# Patient Record
Sex: Female | Born: 1990 | Race: Black or African American | Hispanic: No | Marital: Single | Smoking: Former smoker
Health system: Southern US, Community
[De-identification: ages and names within clinical notes are randomized; demographics above are authoritative.]

## PROBLEM LIST (undated history)

## (undated) DIAGNOSIS — N83209 Unspecified ovarian cyst, unspecified side: Secondary | ICD-10-CM

## (undated) HISTORY — PX: ABDOMINAL HYSTERECTOMY: SHX81

## (undated) HISTORY — PX: UTERINE FIBROID SURGERY: SHX826

## (undated) HISTORY — PX: CARPAL TUNNEL RELEASE: SHX101

## (undated) HISTORY — PX: DILATION AND CURETTAGE OF UTERUS: SHX78

## (undated) HISTORY — PX: BREAST SURGERY: SHX581

---
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
## 2008-01-13 ENCOUNTER — Emergency Department (HOSPITAL_BASED_OUTPATIENT_CLINIC_OR_DEPARTMENT_OTHER): Admission: EM | Admit: 2008-01-13 | Discharge: 2008-01-13 | Payer: Self-pay | Admitting: Emergency Medicine

## 2008-01-18 ENCOUNTER — Emergency Department (HOSPITAL_BASED_OUTPATIENT_CLINIC_OR_DEPARTMENT_OTHER): Admission: EM | Admit: 2008-01-18 | Discharge: 2008-01-18 | Payer: Self-pay | Admitting: Emergency Medicine

## 2008-09-30 ENCOUNTER — Emergency Department (HOSPITAL_BASED_OUTPATIENT_CLINIC_OR_DEPARTMENT_OTHER): Admission: EM | Admit: 2008-09-30 | Discharge: 2008-09-30 | Payer: Self-pay | Admitting: Emergency Medicine

## 2008-10-02 ENCOUNTER — Emergency Department (HOSPITAL_BASED_OUTPATIENT_CLINIC_OR_DEPARTMENT_OTHER): Admission: EM | Admit: 2008-10-02 | Discharge: 2008-10-02 | Payer: Self-pay | Admitting: Emergency Medicine

## 2008-10-19 ENCOUNTER — Emergency Department (HOSPITAL_BASED_OUTPATIENT_CLINIC_OR_DEPARTMENT_OTHER): Admission: EM | Admit: 2008-10-19 | Discharge: 2008-10-19 | Payer: Self-pay | Admitting: Emergency Medicine

## 2009-08-19 ENCOUNTER — Emergency Department (HOSPITAL_BASED_OUTPATIENT_CLINIC_OR_DEPARTMENT_OTHER): Admission: EM | Admit: 2009-08-19 | Discharge: 2009-08-19 | Payer: Self-pay | Admitting: Emergency Medicine

## 2009-10-25 ENCOUNTER — Emergency Department (HOSPITAL_BASED_OUTPATIENT_CLINIC_OR_DEPARTMENT_OTHER): Admission: EM | Admit: 2009-10-25 | Discharge: 2009-10-25 | Payer: Self-pay | Admitting: Emergency Medicine

## 2009-11-10 ENCOUNTER — Emergency Department (HOSPITAL_BASED_OUTPATIENT_CLINIC_OR_DEPARTMENT_OTHER): Admission: EM | Admit: 2009-11-10 | Discharge: 2009-11-10 | Payer: Self-pay | Admitting: Emergency Medicine

## 2009-12-24 ENCOUNTER — Emergency Department (HOSPITAL_BASED_OUTPATIENT_CLINIC_OR_DEPARTMENT_OTHER): Admission: EM | Admit: 2009-12-24 | Discharge: 2009-12-24 | Payer: Self-pay | Admitting: Emergency Medicine

## 2010-03-02 ENCOUNTER — Emergency Department (HOSPITAL_BASED_OUTPATIENT_CLINIC_OR_DEPARTMENT_OTHER)
Admission: EM | Admit: 2010-03-02 | Discharge: 2010-03-02 | Payer: Self-pay | Source: Home / Self Care | Admitting: Emergency Medicine

## 2010-06-08 LAB — URINALYSIS, ROUTINE W REFLEX MICROSCOPIC
Glucose, UA: NEGATIVE mg/dL
Hgb urine dipstick: NEGATIVE
Ketones, ur: NEGATIVE mg/dL
Protein, ur: NEGATIVE mg/dL
Specific Gravity, Urine: 1.027 (ref 1.005–1.030)
Urobilinogen, UA: 1 mg/dL (ref 0.0–1.0)

## 2010-06-08 LAB — URINE MICROSCOPIC-ADD ON

## 2010-06-08 LAB — WET PREP, GENITAL: Trich, Wet Prep: NONE SEEN

## 2010-06-10 LAB — PREGNANCY, URINE: Preg Test, Ur: POSITIVE

## 2010-06-11 LAB — PREGNANCY, URINE: Preg Test, Ur: NEGATIVE

## 2010-06-11 LAB — URINALYSIS, ROUTINE W REFLEX MICROSCOPIC
Bilirubin Urine: NEGATIVE
Hgb urine dipstick: NEGATIVE
Urobilinogen, UA: 0.2 mg/dL (ref 0.0–1.0)
pH: 6 (ref 5.0–8.0)

## 2010-06-12 LAB — HCG, QUANTITATIVE, PREGNANCY: hCG, Beta Chain, Quant, S: 129 m[IU]/mL — ABNORMAL HIGH (ref <5)

## 2010-06-14 LAB — URINALYSIS, ROUTINE W REFLEX MICROSCOPIC
Protein, ur: NEGATIVE mg/dL
Specific Gravity, Urine: 1.031 — ABNORMAL HIGH (ref 1.005–1.030)
Urobilinogen, UA: 1 mg/dL (ref 0.0–1.0)
pH: 6 (ref 5.0–8.0)

## 2010-06-14 LAB — PREGNANCY, URINE: Preg Test, Ur: NEGATIVE

## 2010-06-14 LAB — URINE MICROSCOPIC-ADD ON

## 2010-06-14 LAB — WET PREP, GENITAL
Trich, Wet Prep: NONE SEEN
Yeast Wet Prep HPF POC: NONE SEEN

## 2010-07-20 ENCOUNTER — Emergency Department (HOSPITAL_BASED_OUTPATIENT_CLINIC_OR_DEPARTMENT_OTHER)
Admission: EM | Admit: 2010-07-20 | Discharge: 2010-07-20 | Disposition: A | Discharge status: Home / Self Care | Payer: Medicaid Other | Attending: Emergency Medicine | Admitting: Emergency Medicine

## 2010-07-20 DIAGNOSIS — L723 Sebaceous cyst: Secondary | ICD-10-CM | POA: Insufficient documentation

## 2010-07-20 DIAGNOSIS — R220 Localized swelling, mass and lump, head: Secondary | ICD-10-CM | POA: Insufficient documentation

## 2010-10-11 ENCOUNTER — Encounter: Payer: Self-pay | Admitting: *Deleted

## 2010-10-11 ENCOUNTER — Emergency Department (HOSPITAL_BASED_OUTPATIENT_CLINIC_OR_DEPARTMENT_OTHER)
Admission: EM | Admit: 2010-10-11 | Discharge: 2010-10-11 | Disposition: A | Discharge status: Home / Self Care | Payer: Medicaid Other | Attending: Emergency Medicine | Admitting: Emergency Medicine

## 2010-10-11 DIAGNOSIS — H5710 Ocular pain, unspecified eye: Secondary | ICD-10-CM | POA: Insufficient documentation

## 2010-10-11 DIAGNOSIS — H00019 Hordeolum externum unspecified eye, unspecified eyelid: Secondary | ICD-10-CM | POA: Insufficient documentation

## 2010-10-11 MED ORDER — TOBRAMYCIN 0.3 % OP SOLN: 1.0000 [drp] | OPHTHALMIC | Status: AC

## 2010-10-11 NOTE — ED Provider Notes (Signed)
History     Chief Complaint  Patient presents with  . Eye Pain   HPI Comments: Pt reports she awoke with swelling to her left eye yesterday morning and noticed a knot.  Pt complains of swelling and sorness  Patient is a 20 y.o. female presenting with eye pain. The history is provided by the patient.  Eye Pain This is a new problem. The current episode started yesterday. Pertinent negatives include no visual change. The symptoms are aggravated by nothing. She has tried nothing for the symptoms.    History reviewed. No pertinent past medical history.  History reviewed. No pertinent past surgical history.  History reviewed. No pertinent family history.  History  Substance Use Topics  . Smoking status: Never Smoker   . Smokeless tobacco: Not on file  . Alcohol Use: No    OB History    Grav Para Term Preterm Abortions TAB SAB Ect Mult Living                  Review of Systems  Eyes: Positive for pain.    Physical Exam  BP 123/73  Pulse 72  Temp(Src) 98.9 F (37.2 C) (Oral)  Resp 16  Ht 5\' 5"  (1.651 m)  Wt 150 lb (68.04 kg)  BMI 24.96 kg/m2  SpO2 100%  Physical Exam  Constitutional: She appears well-developed and well-nourished.  HENT:  Head: Normocephalic.  Eyes: Left eye exhibits discharge.  Neck: Normal range of motion. Neck supple.  Cardiovascular: Normal rate.   Pulmonary/Chest: Effort normal.  Skin: Skin is warm and dry.  Psychiatric: She has a normal mood and affect.    ED Course  Procedures  MDM  Pt counseled on need to follow up with opthamology in 1 week if area persist.      Langston Masker, [place] 10/11/10 1232

## 2010-10-11 NOTE — ED Notes (Signed)
Pt amb to room 8 with quick steady gait in nad.  Pt reports sudden onset of left eye pain and drainage yesterday.

## 2010-10-25 NOTE — ED Provider Notes (Signed)
History/physical exam/procedure(s) were performed by non-physician practitioner and as supervising physician I was immediately available for consultation/collaboration. I have reviewed all notes and am in agreement with care and plan.   Hilario Quarry, MD 10/25/10 1452

## 2010-12-28 LAB — COMPREHENSIVE METABOLIC PANEL
ALT: 18
CO2: 27
Calcium: 9.3
Creatinine, Ser: 0.7
GFR calc Af Amer: >60
GFR calc non Af Amer: >60
Glucose, Bld: 95
Sodium: 141
Total Protein: 8.4 — ABNORMAL HIGH

## 2010-12-28 LAB — BASIC METABOLIC PANEL
BUN: 9
CO2: 26
Chloride: 103
Creatinine, Ser: 0.9
GFR calc Af Amer: >60
Potassium: 3.7

## 2010-12-28 LAB — URINALYSIS, ROUTINE W REFLEX MICROSCOPIC
Bilirubin Urine: NEGATIVE
Bilirubin Urine: NEGATIVE
Glucose, UA: NEGATIVE
Glucose, UA: NEGATIVE
Hgb urine dipstick: NEGATIVE
Ketones, ur: NEGATIVE
Ketones, ur: NEGATIVE
Leukocytes, UA: NEGATIVE
Leukocytes, UA: NEGATIVE
Nitrite: NEGATIVE
Nitrite: NEGATIVE
Protein, ur: NEGATIVE
Protein, ur: NEGATIVE
Specific Gravity, Urine: 1.016
Specific Gravity, Urine: 1.02
Urobilinogen, UA: 2 — ABNORMAL HIGH
pH: 7
pH: 7

## 2010-12-28 LAB — CBC
MCV: 76.4 — ABNORMAL LOW
RBC: 4.65
RDW: 13.3

## 2010-12-28 LAB — DIFFERENTIAL
Basophils Relative: 0
Eosinophils Relative: 2
Lymphocytes Relative: 19
Lymphs Abs: 1.3
Neutro Abs: 4.8

## 2010-12-28 LAB — WET PREP, GENITAL
Clue Cells Wet Prep HPF POC: NONE SEEN
Yeast Wet Prep HPF POC: NONE SEEN

## 2010-12-28 LAB — URINE MICROSCOPIC-ADD ON

## 2011-06-02 ENCOUNTER — Emergency Department (HOSPITAL_BASED_OUTPATIENT_CLINIC_OR_DEPARTMENT_OTHER)
Admission: EM | Admit: 2011-06-02 | Discharge: 2011-06-03 | Disposition: A | Discharge status: Home / Self Care | Payer: Medicaid Other | Attending: Emergency Medicine | Admitting: Emergency Medicine

## 2011-06-02 ENCOUNTER — Encounter (HOSPITAL_BASED_OUTPATIENT_CLINIC_OR_DEPARTMENT_OTHER): Payer: Self-pay | Admitting: *Deleted

## 2011-06-02 DIAGNOSIS — J029 Acute pharyngitis, unspecified: Secondary | ICD-10-CM | POA: Insufficient documentation

## 2011-06-02 DIAGNOSIS — Z880 Allergy status to penicillin: Secondary | ICD-10-CM | POA: Insufficient documentation

## 2011-06-02 LAB — RAPID STREP SCREEN (MED CTR MEBANE ONLY): Streptococcus, Group A Screen (Direct): NEGATIVE

## 2011-06-02 NOTE — ED Notes (Signed)
Pt began having cold sx and sore throat over the weekend

## 2011-06-03 MED ORDER — KETOROLAC TROMETHAMINE 60 MG/2ML IM SOLN
60.0000 mg | Freq: Once | INTRAMUSCULAR | Status: AC
  Administered 2011-06-03: 60 mg via INTRAMUSCULAR
  Filled 2011-06-03: qty 2

## 2011-06-03 MED ORDER — OXYCODONE-ACETAMINOPHEN 5-325 MG PO TABS: 2.0000 | ORAL_TABLET | ORAL | Status: AC | PRN

## 2011-06-03 NOTE — ED Provider Notes (Signed)
History     CSN: [id number]  Arrival date & time 06/02/11  2311   First MD Initiated Contact with Patient 06/02/11 2357      Chief Complaint  Patient presents with  . Sore Throat    (Consider location/radiation/quality/duration/timing/severity/associated sxs/prior treatment) Patient is a 21 y.o. female presenting with pharyngitis. The history is provided by the patient.  Sore Throat Pertinent negatives include no chest pain, no headaches and no shortness of breath.   the patient is a 21 year old, female, with no past medical history.  He does not smoke cigarettes, who presents to emergency department complaining of a nonproductive cough, and sore throat for several days.  She has not had a fever, nausea, or vomiting.  Nothing else hurts besides her throat.  She is not taking any medications.  She is allergic to penicillin.  History reviewed. No pertinent past medical history.  History reviewed. No pertinent past surgical history.  History reviewed. No pertinent family history.  History  Substance Use Topics  . Smoking status: Never Smoker   . Smokeless tobacco: Not on file  . Alcohol Use: No    OB History    Grav Para Term Preterm Abortions TAB SAB Ect Mult Living                  Review of Systems  Constitutional: Negative for fever and chills.  HENT: Positive for sore throat. Negative for congestion and voice change.   Respiratory: Positive for cough. Negative for shortness of breath.   Cardiovascular: Negative for chest pain.  Gastrointestinal: Negative for nausea and vomiting.  Skin: Negative for rash.  Neurological: Negative for headaches.  All other systems reviewed and are negative.    Allergies  Penicillins  Home Medications   Current Outpatient Rx  Name Route Sig Dispense Refill  . GUAIFENESIN 100 MG/5ML PO LIQD Oral Take 200 mg by mouth 3 (three) times daily as needed. For congestion      BP 136/86  Pulse 75  Temp(Src) 97.5 F (36.4 C) (Oral)   Resp 18  Ht 5\' 6"  (1.676 m)  Wt 170 lb (77.111 kg)  BMI 27.44 kg/m2  SpO2 100%  LMP 05/05/2011  Physical Exam  Vitals reviewed. Constitutional: She is oriented to person, place, and time. She appears well-developed and well-nourished.  HENT:  Head: Normocephalic and atraumatic.  Mouth/Throat: Oropharynx is clear and moist. No oropharyngeal exudate.  Eyes: Conjunctivae are normal. Pupils are equal, round, and reactive to light.  Neck: Normal range of motion. Neck supple.  Pulmonary/Chest: Effort normal.  Abdominal: She exhibits no distension.  Musculoskeletal: Normal range of motion.  Lymphadenopathy:    She has no cervical adenopathy.  Neurological: She is alert and oriented to person, place, and time.  Skin: Skin is warm and dry.  Psychiatric: She has a normal mood and affect.    ED Course  Procedures (including critical care time) 21 year old, female, with no past medical history presents emergency department with a nonproductive cough, and sore throat for several days.  Her physical examination is normal.  Strep test is negative.  We'll treat her for a viral pharyngitis   Labs Reviewed  RAPID STREP SCREEN   No results found.   No diagnosis found.    MDM  Pharyngitis No exudate and negative strep test.  Her sore throat is associated with a nonproductive cough.  This is consistent with a viral infection.  No antibiotics are indicated.  She has no signs of peritonsillar abscess or  airway compromise        Nicholes Stairs, MD 06/03/11 [phone]

## 2011-08-11 ENCOUNTER — Encounter (HOSPITAL_BASED_OUTPATIENT_CLINIC_OR_DEPARTMENT_OTHER): Payer: Self-pay

## 2011-08-11 ENCOUNTER — Emergency Department (HOSPITAL_BASED_OUTPATIENT_CLINIC_OR_DEPARTMENT_OTHER)
Admission: EM | Admit: 2011-08-11 | Discharge: 2011-08-11 | Disposition: A | Discharge status: Home / Self Care | Payer: Medicaid Other | Attending: Emergency Medicine | Admitting: Emergency Medicine

## 2011-08-11 ENCOUNTER — Emergency Department (HOSPITAL_BASED_OUTPATIENT_CLINIC_OR_DEPARTMENT_OTHER): Payer: Medicaid Other

## 2011-08-11 DIAGNOSIS — N898 Other specified noninflammatory disorders of vagina: Secondary | ICD-10-CM | POA: Insufficient documentation

## 2011-08-11 DIAGNOSIS — R1031 Right lower quadrant pain: Secondary | ICD-10-CM | POA: Insufficient documentation

## 2011-08-11 DIAGNOSIS — R110 Nausea: Secondary | ICD-10-CM | POA: Insufficient documentation

## 2011-08-11 DIAGNOSIS — F17200 Nicotine dependence, unspecified, uncomplicated: Secondary | ICD-10-CM | POA: Insufficient documentation

## 2011-08-11 DIAGNOSIS — R10813 Right lower quadrant abdominal tenderness: Secondary | ICD-10-CM | POA: Insufficient documentation

## 2011-08-11 HISTORY — DX: Unspecified ovarian cyst, unspecified side: N83.209

## 2011-08-11 LAB — CBC
HCT: 36.6 % (ref 36.0–46.0)
Hemoglobin: 12.2 g/dL (ref 12.0–15.0)
MCH: 25.6 pg — ABNORMAL LOW (ref 26.0–34.0)
MCHC: 33.3 g/dL (ref 30.0–36.0)
MCV: 76.7 fL — ABNORMAL LOW (ref 78.0–100.0)
RDW: 14.6 % (ref 11.5–15.5)

## 2011-08-11 LAB — DIFFERENTIAL
Basophils Absolute: 0 10*3/uL (ref 0.0–0.1)
Basophils Relative: 0 % (ref 0–1)
Eosinophils Relative: 2 % (ref 0–5)
Monocytes Absolute: 0.6 10*3/uL (ref 0.1–1.0)
Monocytes Relative: 10 % (ref 3–12)
Neutro Abs: 3.3 10*3/uL (ref 1.7–7.7)

## 2011-08-11 LAB — PREGNANCY, URINE: Preg Test, Ur: NEGATIVE

## 2011-08-11 LAB — URINALYSIS, ROUTINE W REFLEX MICROSCOPIC
Nitrite: NEGATIVE
Protein, ur: 30 mg/dL — AB
Specific Gravity, Urine: 1.035 — ABNORMAL HIGH (ref 1.005–1.030)
Urobilinogen, UA: 1 mg/dL (ref 0.0–1.0)

## 2011-08-11 LAB — BASIC METABOLIC PANEL
BUN: 11 mg/dL (ref 6–23)
Calcium: 9.3 mg/dL (ref 8.4–10.5)
Chloride: 105 mEq/L (ref 96–112)
Creatinine, Ser: 0.9 mg/dL (ref 0.50–1.10)
GFR calc Af Amer: >90 mL/min (ref >90)

## 2011-08-11 LAB — URINE MICROSCOPIC-ADD ON

## 2011-08-11 MED ORDER — HYDROCODONE-ACETAMINOPHEN 5-325 MG PO TABS
1.0000 | ORAL_TABLET | Freq: Once | ORAL | Status: AC
  Administered 2011-08-11: 1 via ORAL
  Filled 2011-08-11: qty 1

## 2011-08-11 MED ORDER — KETOROLAC TROMETHAMINE 60 MG/2ML IM SOLN
60.0000 mg | Freq: Once | INTRAMUSCULAR | Status: AC
  Administered 2011-08-11: 60 mg via INTRAMUSCULAR
  Filled 2011-08-11: qty 2

## 2011-08-11 MED ORDER — HYDROCODONE-ACETAMINOPHEN 5-325 MG PO TABS: 1.0000 | ORAL_TABLET | ORAL | Status: AC | PRN

## 2011-08-11 NOTE — Discharge Instructions (Signed)
Please followup with your primary care physician and her gynecologist if this pain persists.  We have been unable to determine an etiology for your pain but there are no signs of appendicitis or kidney stone on your CAT scan today.  Abdominal Pain Abdominal pain can be caused by many things. Your caregiver decides the seriousness of your pain by an examination and possibly blood tests and X-rays. Many cases can be observed and treated at home. Most abdominal pain is not caused by a disease and will probably improve without treatment. However, in many cases, more time must pass before a clear cause of the pain can be found. Before that point, it may not be known if you need more testing, or if hospitalization or surgery is needed. HOME CARE INSTRUCTIONS   Do not take laxatives unless directed by your caregiver.   Take pain medicine only as directed by your caregiver.   Only take over-the-counter or prescription medicines for pain, discomfort, or fever as directed by your caregiver.   Try a clear liquid diet (broth, tea, or water) for as long as directed by your caregiver. Slowly move to a bland diet as tolerated.  SEEK IMMEDIATE MEDICAL CARE IF:   The pain does not go away.   You have a fever.   You keep throwing up (vomiting).   The pain is felt only in portions of the abdomen. Pain in the right side could possibly be appendicitis. In an adult, pain in the left lower portion of the abdomen could be colitis or diverticulitis.   You pass bloody or black tarry stools.  MAKE SURE YOU:   Understand these instructions.   Will watch your condition.   Will get help right away if you are not doing well or get worse.  Document Released: 12/22/2004 Document Revised: 03/03/2011 Document Reviewed: 10/31/2007 [facility] Patient Information 2012 [place], [place].

## 2011-08-11 NOTE — ED Provider Notes (Addendum)
History     CSN: [id number]  Arrival date & time 08/11/11  2045   First MD Initiated Contact with Patient 08/11/11 2124      Chief Complaint  Patient presents with  . Abdominal Pain    (Consider location/radiation/quality/duration/timing/severity/associated sxs/prior treatment) HPI Comments: Patient presents complaining of right lower quadrant pain for approximately 5 days.  Patient notes it's been constant.  She had some mild nausea yesterday but has had no significant other vomiting or diarrhea symptoms.  Her bowel movements are normal.  No dysuria or gross hematuria.  Patient states her menstrual period was last week but she has noted some spotting this week.  She did see her gynecologist today because she thought she might have another ovarian cyst.  She did have an ultrasound there where her gynecologist noted she had no ovarian cyst and at other testing which demonstrated no signs of infection per her report.  Patient does deny vaginal discharge.  He recommended she seek further evaluation for possible appendicitis.  Patient is a 21 y.o. female presenting with abdominal pain. The history is provided by the patient. No language interpreter was used.  Abdominal Pain The primary symptoms of the illness include abdominal pain, nausea and vaginal bleeding. The primary symptoms of the illness do not include fever, fatigue, shortness of breath, vomiting, diarrhea, hematemesis, hematochezia, dysuria or vaginal discharge. The current episode started more than 2 days ago. The onset of the illness was gradual. The problem has not changed since onset. Symptoms associated with the illness do not include chills or back pain.    Past Medical History  Diagnosis Date  . Ovarian cyst     History reviewed. No pertinent past surgical history.  History reviewed. No pertinent family history.  History  Substance Use Topics  . Smoking status: Current Some Day Smoker  . Smokeless tobacco: Not on file   . Alcohol Use: Yes    OB History    Grav Para Term Preterm Abortions TAB SAB Ect Mult Living                  Review of Systems  Constitutional: Negative.  Negative for fever, chills and fatigue.  HENT: Negative.   Eyes: Negative.  Negative for discharge and redness.  Respiratory: Negative.  Negative for cough and shortness of breath.   Cardiovascular: Negative.  Negative for chest pain.  Gastrointestinal: Positive for nausea and abdominal pain. Negative for vomiting, diarrhea, hematochezia and hematemesis.  Genitourinary: Positive for vaginal bleeding. Negative for dysuria and vaginal discharge.  Musculoskeletal: Negative.  Negative for back pain.  Skin: Negative.  Negative for color change and rash.  Neurological: Negative.  Negative for syncope and headaches.  Hematological: Negative.  Negative for adenopathy.  Psychiatric/Behavioral: Negative.  Negative for confusion.  All other systems reviewed and are negative.    Allergies  Penicillins  Home Medications   Current Outpatient Rx  Name Route Sig Dispense Refill  . GUAIFENESIN 100 MG/5ML PO LIQD Oral Take 200 mg by mouth 3 (three) times daily as needed. For congestion      BP 141/97  Pulse 81  Temp(Src) 98.3 F (36.8 C) (Oral)  Resp 16  Ht 5\' 6"  (1.676 m)  Wt 175 lb (79.379 kg)  BMI 28.25 kg/m2  SpO2 100%  LMP 08/04/2011  Physical Exam  Nursing note and vitals reviewed. Constitutional: She is oriented to person, place, and time. She appears well-developed and well-nourished.  Non-toxic appearance. She does not have a sickly  appearance.  HENT:  Head: Normocephalic and atraumatic.  Eyes: Conjunctivae, EOM and lids are normal. Pupils are equal, round, and reactive to light. No scleral icterus.  Neck: Trachea normal and normal range of motion. Neck supple.  Cardiovascular: Normal rate, regular rhythm and normal heart sounds.   Pulmonary/Chest: Effort normal and breath sounds normal.  Abdominal: Soft. Normal  appearance. There is tenderness. There is no rebound, no guarding and no CVA tenderness.        Mild right lower quadrant tenderness with deep palpation  Musculoskeletal: Normal range of motion.  Neurological: She is alert and oriented to person, place, and time. She has normal strength.  Skin: Skin is warm, dry and intact. No rash noted.  Psychiatric: She has a normal mood and affect. Her behavior is normal. Judgment and thought content normal.    ED Course  Procedures (including critical care time)  Results for orders placed during the hospital encounter of 08/11/11  URINALYSIS, ROUTINE W REFLEX MICROSCOPIC      Component Value Range   Color, Urine YELLOW  YELLOW    APPearance CLOUDY (*) CLEAR    Specific Gravity, Urine 1.035 (*) 1.005 - 1.030    pH 6.5  5.0 - 8.0    Glucose, UA NEGATIVE  NEGATIVE (mg/dL)   Hgb urine dipstick LARGE (*) NEGATIVE    Bilirubin Urine NEGATIVE  NEGATIVE    Ketones, ur NEGATIVE  NEGATIVE (mg/dL)   Protein, ur 30 (*) NEGATIVE (mg/dL)   Urobilinogen, UA 1.0  0.0 - 1.0 (mg/dL)   Nitrite NEGATIVE  NEGATIVE    Leukocytes, UA SMALL (*) NEGATIVE   PREGNANCY, URINE      Component Value Range   Preg Test, Ur NEGATIVE  NEGATIVE   URINE MICROSCOPIC-ADD ON      Component Value Range   Squamous Epithelial / LPF FEW (*) RARE    WBC, UA 3-6  <3 (WBC/hpf)   RBC / HPF 7-10  <3 (RBC/hpf)   Bacteria, UA FEW (*) RARE   CBC      Component Value Range   WBC 5.8  4.0 - 10.5 (K/uL)   RBC 4.77  3.87 - 5.11 (MIL/uL)   Hemoglobin 12.2  12.0 - 15.0 (g/dL)   HCT 21.3  08.6 - 57.8 (%)   MCV 76.7 (*) 78.0 - 100.0 (fL)   MCH 25.6 (*) 26.0 - 34.0 (pg)   MCHC 33.3  30.0 - 36.0 (g/dL)   RDW 46.9  62.9 - 52.8 (%)   Platelets 236  150 - 400 (K/uL)  DIFFERENTIAL      Component Value Range   Neutrophils Relative 57  43 - 77 (%)   Neutro Abs 3.3  1.7 - 7.7 (K/uL)   Lymphocytes Relative 31  12 - 46 (%)   Lymphs Abs 1.8  0.7 - 4.0 (K/uL)   Monocytes Relative 10  3 - 12 (%)    Monocytes Absolute 0.6  0.1 - 1.0 (K/uL)   Eosinophils Relative 2  0 - 5 (%)   Eosinophils Absolute 0.1  0.0 - 0.7 (K/uL)   Basophils Relative 0  0 - 1 (%)   Basophils Absolute 0.0  0.0 - 0.1 (K/uL)  BASIC METABOLIC PANEL      Component Value Range   Sodium 139  135 - 145 (mEq/L)   Potassium 3.9  3.5 - 5.1 (mEq/L)   Chloride 105  96 - 112 (mEq/L)   CO2 27  19 - 32 (mEq/L)   Glucose, Bld  99  70 - 99 (mg/dL)   BUN 11  6 - 23 (mg/dL)   Creatinine, Ser 0.98  0.50 - 1.10 (mg/dL)   Calcium 9.3  8.4 - 11.9 (mg/dL)   GFR calc non Af Amer 89 (*) >90 (mL/min)   GFR calc Af Amer >90  >90 (mL/min)   Ct Abdomen Pelvis Wo Contrast  08/11/2011  *RADIOLOGY REPORT*  Clinical Data: Right lower quadrant pain and hematuria.  CT ABDOMEN AND PELVIS WITHOUT CONTRAST  Technique:  Multidetector CT imaging of the abdomen and pelvis was performed following the standard protocol without intravenous contrast.  Comparison: 01/13/2008  Findings: The lung bases are clear.  The kidneys appear symmetrical.  No renal, ureteral, or bladder stones are demonstrated.  No pyelocaliectasis or ureterectasis.  The bladder is decompressed without evidence of significant wall thickening.  The unenhanced appearance of the liver, spleen, gallbladder, pancreas, adrenal glands, abdominal aorta, and retroperitoneal lymph nodes is unremarkable.  The stomach, small bowel, and colon are decompressed.  No free air or free fluid in the abdomen.  Fat in the umbilicus.  Pelvis:  The appendix is normal.  Uterus and adnexal structures are not enlarged.  The uterus appears retroverted.  No significant pelvic lymphadenopathy.  Normal alignment of the lumbar vertebrae.  IMPRESSION: No renal or ureteral stone or obstruction.  Normal appendix.  Original Report Authenticated By: Marlon Pel, M.D.        MDM  Patient with right lower quadrant abdominal pain for 4-5 days or longer.  Patient does note she's had some vaginal spotting which might  account for the blood in her urine but I also consideration for kidney stone in this patient's differential diagnosis.  Appendicitis is a consideration but seems less likely since the patient is afebrile and has no anorexia, significant nausea vomiting or changes in bowel habits.  She has already been evaluated by her gynecologist and had a pelvic exam and ultrasound that ruled out ovarian cysts, torsion or other STDs.  Therefore I will not complete a pelvic exam here today.  Nat Christen, MD 08/11/11 2136  Patient with normal laboratory studies and a normal CT at this time.  I do not have an acute cause for the patient's symptoms as she has no signs of appendicitis or kidney stone.  She is artery had gynecologic evaluation as noted above.  She has no leukocytosis to suggest occult infection.  She has a relatively benign abdominal exam at this time as well.  I will suggest further followup with her primary care physician and her gynecologist for outpatient workup as needed  Nat Christen, MD 08/11/11 2254

## 2011-08-11 NOTE — ED Notes (Addendum)
abd pain x 1 week-seen by PCP yesterday- GYN today-no dx

## 2011-08-11 NOTE — ED Notes (Signed)
Pt returned from CT °

## 2011-08-11 NOTE — ED Notes (Signed)
Dr Hosmer at bedside. 

## 2013-03-19 ENCOUNTER — Encounter (HOSPITAL_BASED_OUTPATIENT_CLINIC_OR_DEPARTMENT_OTHER): Payer: Self-pay | Admitting: Emergency Medicine

## 2013-03-19 ENCOUNTER — Emergency Department (HOSPITAL_BASED_OUTPATIENT_CLINIC_OR_DEPARTMENT_OTHER)
Admission: EM | Admit: 2013-03-19 | Discharge: 2013-03-19 | Disposition: A | Discharge status: Home / Self Care | Payer: Medicaid Other | Attending: Emergency Medicine | Admitting: Emergency Medicine

## 2013-03-19 DIAGNOSIS — J069 Acute upper respiratory infection, unspecified: Secondary | ICD-10-CM | POA: Insufficient documentation

## 2013-03-19 DIAGNOSIS — Z880 Allergy status to penicillin: Secondary | ICD-10-CM | POA: Insufficient documentation

## 2013-03-19 DIAGNOSIS — Z87891 Personal history of nicotine dependence: Secondary | ICD-10-CM | POA: Insufficient documentation

## 2013-03-19 DIAGNOSIS — Z8742 Personal history of other diseases of the female genital tract: Secondary | ICD-10-CM | POA: Insufficient documentation

## 2013-03-19 MED ORDER — MOMETASONE FUROATE 50 MCG/ACT NA SUSP: 2.0000 | Freq: Every day | NASAL | Status: DC

## 2013-03-19 MED ORDER — IBUPROFEN 800 MG PO TABS: 800.0000 mg | ORAL_TABLET | Freq: Three times a day (TID) | ORAL | Status: DC

## 2013-03-19 MED ORDER — GUAIFENESIN ER 600 MG PO TB12: 600.0000 mg | ORAL_TABLET | Freq: Two times a day (BID) | ORAL | Status: DC

## 2013-03-19 NOTE — ED Notes (Addendum)
Congestion, cough  Sinus drainage since Saturday,,  No relief w otc meds

## 2013-03-19 NOTE — ED Provider Notes (Signed)
CSN: [id number]     Arrival date & time 03/19/13  0204 History   First MD Initiated Contact with Patient 03/19/13 0220     Chief Complaint  Patient presents with  . Cough   (Consider location/radiation/quality/duration/timing/severity/associated sxs/prior Treatment) Patient is a 22 y.o. female presenting with cough. The history is provided by the patient. No language interpreter was used.  Cough Cough characteristics:  Non-productive Severity:  Mild Onset quality:  Gradual Duration:  1 day Timing:  Intermittent Progression:  Unchanged Chronicity:  New Context: upper respiratory infection   Context: not animal exposure   Relieved by:  Nothing Worsened by:  Nothing tried Ineffective treatments:  None tried Associated symptoms: sinus congestion   Associated symptoms: no chest pain, no diaphoresis, no ear pain, no fever and no wheezing   Risk factors: no chemical exposure     Past Medical History  Diagnosis Date  . Ovarian cyst    History reviewed. No pertinent past surgical history. History reviewed. No pertinent family history. History  Substance Use Topics  . Smoking status: Former Games developer  . Smokeless tobacco: Not on file  . Alcohol Use: Yes   OB History   Grav Para Term Preterm Abortions TAB SAB Ect Mult Living                 Review of Systems  Constitutional: Negative for fever and diaphoresis.  HENT: Positive for congestion. Negative for ear pain.   Respiratory: Positive for cough. Negative for wheezing.   Cardiovascular: Negative for chest pain.  All other systems reviewed and are negative.    Allergies  Penicillins  Home Medications   Current Outpatient Rx  Name  Route  Sig  Dispense  Refill  . guaiFENesin (MUCINEX) 600 MG 12 hr tablet   Oral   Take 1 tablet (600 mg total) by mouth 2 (two) times daily.   14 tablet   0   . ibuprofen (ADVIL,MOTRIN) 800 MG tablet   Oral   Take 1 tablet (800 mg total) by mouth 3 (three) times daily.   21 tablet   0   . mometasone (NASONEX) 50 MCG/ACT nasal spray   Nasal   Place 2 sprays into the nose daily.   17 g   0   . NAPROXEN PO   Oral   Take 1 tablet by mouth daily as needed. Patient used this medication for pain.          BP 130/84  Pulse 80  Temp(Src) 98 F (36.7 C) (Oral)  Resp 18  Ht 5\' 6"  (1.676 m)  Wt 170 lb (77.111 kg)  BMI 27.45 kg/m2  SpO2 95%  LMP 02/26/2013 Physical Exam  Constitutional: She is oriented to person, place, and time. She appears well-developed and well-nourished. No distress.  HENT:  Head: Normocephalic and atraumatic.  Mouth/Throat: Oropharynx is clear and moist. No oropharyngeal exudate.  Eyes: Conjunctivae are normal. Pupils are equal, round, and reactive to light.  Neck: Normal range of motion. Neck supple.  Cardiovascular: Normal rate and regular rhythm.   Pulmonary/Chest: Effort normal and breath sounds normal. She has no wheezes. She has no rales.  Abdominal: Soft. Bowel sounds are normal. There is no tenderness. There is no rebound and no guarding.  Musculoskeletal: Normal range of motion.  Neurological: She is alert and oriented to person, place, and time.  Skin: Skin is warm and dry.  Psychiatric: She has a normal mood and affect.    ED Course  Procedures (including  critical care time) Labs Review Labs Reviewed - No data to display Imaging Review No results found.  EKG Interpretation   None       MDM   1. URI (upper respiratory infection)    Will treat for URI    Elcie Pelster K Jenascia Bumpass-Rasch, MD 03/19/13 [phone]

## 2013-03-19 NOTE — ED Notes (Signed)
Cough and congestion since Saturday. No relief from OTC meds.

## 2013-08-23 ENCOUNTER — Emergency Department (HOSPITAL_BASED_OUTPATIENT_CLINIC_OR_DEPARTMENT_OTHER)
Admission: EM | Admit: 2013-08-23 | Discharge: 2013-08-23 | Disposition: A | Discharge status: Home / Self Care | Payer: Medicaid Other | Attending: Emergency Medicine | Admitting: Emergency Medicine

## 2013-08-23 ENCOUNTER — Encounter (HOSPITAL_BASED_OUTPATIENT_CLINIC_OR_DEPARTMENT_OTHER): Payer: Self-pay | Admitting: Emergency Medicine

## 2013-08-23 DIAGNOSIS — R51 Headache: Secondary | ICD-10-CM | POA: Insufficient documentation

## 2013-08-23 DIAGNOSIS — Z8742 Personal history of other diseases of the female genital tract: Secondary | ICD-10-CM | POA: Insufficient documentation

## 2013-08-23 DIAGNOSIS — M542 Cervicalgia: Secondary | ICD-10-CM | POA: Insufficient documentation

## 2013-08-23 DIAGNOSIS — Z791 Long term (current) use of non-steroidal anti-inflammatories (NSAID): Secondary | ICD-10-CM | POA: Insufficient documentation

## 2013-08-23 DIAGNOSIS — R519 Headache, unspecified: Secondary | ICD-10-CM

## 2013-08-23 DIAGNOSIS — Z880 Allergy status to penicillin: Secondary | ICD-10-CM | POA: Insufficient documentation

## 2013-08-23 DIAGNOSIS — Z87891 Personal history of nicotine dependence: Secondary | ICD-10-CM | POA: Insufficient documentation

## 2013-08-23 MED ORDER — CYCLOBENZAPRINE HCL 10 MG PO TABS: 10.0000 mg | ORAL_TABLET | Freq: Two times a day (BID) | ORAL | Status: DC | PRN

## 2013-08-23 MED ORDER — HYDROCODONE-ACETAMINOPHEN 5-325 MG PO TABS
1.0000 | ORAL_TABLET | Freq: Once | ORAL | Status: AC
  Administered 2013-08-23: 1 via ORAL
  Filled 2013-08-23: qty 1

## 2013-08-23 MED ORDER — HYDROCODONE-ACETAMINOPHEN 5-325 MG PO TABS: 1.0000 | ORAL_TABLET | Freq: Four times a day (QID) | ORAL | Status: DC | PRN

## 2013-08-23 MED ORDER — NAPROXEN 500 MG PO TABS: 500.0000 mg | ORAL_TABLET | Freq: Two times a day (BID) | ORAL | Status: DC

## 2013-08-23 NOTE — ED Provider Notes (Signed)
CSN: [id number]     Arrival date & time 08/23/13  2033 History  This chart was scribed for Vanetta MuldersScott Ras Kollman, MD by Dorothey Basemania Sutton, ED Scribe. This patient was seen in room MH09/MH09 and the patient's care was started at 10:26 PM.    Chief Complaint  Patient presents with  . Back Pain   Patient is a 23 y.o. female presenting with back pain. The history is provided by the patient. No language interpreter was used.  Back Pain Pain location: cervical/upper. Pain severity:  Severe Onset quality:  Gradual Timing:  Constant Progression:  Unchanged Chronicity:  New Context: not recent injury   Relieved by:  None tried Ineffective treatments:  None tried Associated symptoms: headaches   Associated symptoms: no abdominal pain, no chest pain, no dysuria, no fever, no numbness and no paresthesias   Headaches:    Severity:  Moderate   Onset quality:  Gradual   Timing:  Constant   Progression:  Unchanged   Chronicity:  New Risk factors: obesity    HPI Comments: Anna Koch is a 23 y.o. female who presents to the Emergency Department complaining of a constant pain, rated 8/10 currently, to the neck and upper back, worse on the left, onset around 16 hours ago upon waking from sleep. She denies any potential injury or trauma to the areas. Patient reports an associated, left-sided headache that she states presented after the neck pain. She reports a history of similar pain due to muscle spasm, but states that the headache is new. She denies fever, chills nausea, vomiting, numbness, paresthesias, visual disturbance, dizziness, cough, rhinorrhea, sore throat, chest pain, shortness of breath, abdominal pain, nausea, vomiting, diarrhea, dysuria, rash, leg swelling, history of bleeding easily. Patient has an allergy to penicillins. Patient has no other pertinent medical history.   Past Medical History  Diagnosis Date  . Ovarian cyst    History reviewed. No pertinent past surgical history. History  reviewed. No pertinent family history. History  Substance Use Topics  . Smoking status: Former Games developermoker  . Smokeless tobacco: Not on file  . Alcohol Use: Yes   OB History   Grav Para Term Preterm Abortions TAB SAB Ect Mult Living                 Review of Systems  Constitutional: Negative for fever and chills.  HENT: Negative for rhinorrhea and sore throat.   Eyes: Negative for visual disturbance.  Respiratory: Negative for cough and shortness of breath.   Cardiovascular: Negative for chest pain.  Gastrointestinal: Negative for nausea, vomiting, abdominal pain and diarrhea.  Genitourinary: Negative for dysuria and hematuria.  Musculoskeletal: Positive for back pain and neck pain.  Skin: Negative for rash.  Neurological: Positive for headaches. Negative for dizziness, numbness and paresthesias.  Hematological: Does not bruise/bleed easily.  Psychiatric/Behavioral: Negative for confusion.      Allergies  Penicillins  Home Medications   Prior to Admission medications   Medication Sig Start Date End Date Taking? Authorizing Provider  ibuprofen (ADVIL,MOTRIN) 800 MG tablet Take 1 tablet (800 mg total) by mouth 3 (three) times daily. 03/19/13  Yes April K Palumbo-Rasch, MD  cyclobenzaprine (FLEXERIL) 10 MG tablet Take 1 tablet (10 mg total) by mouth 2 (two) times daily as needed for muscle spasms. 08/23/13   Vanetta MuldersScott Roselyn Doby, MD  HYDROcodone-acetaminophen (NORCO/VICODIN) 5-325 MG per tablet Take 1-2 tablets by mouth every 6 (six) hours as needed for moderate pain. 08/23/13   Vanetta MuldersScott Shantea Poulton, MD  naproxen (NAPROSYN) 500 MG  tablet Take 1 tablet (500 mg total) by mouth 2 (two) times daily. 08/23/13   Vanetta Mulders, MD   Triage Vitals: BP 135/94  Pulse 73  Temp(Src) 98.7 F (37.1 C) (Oral)  Resp 16  Ht 5\' 6"  (1.676 m)  Wt 175 lb (79.379 kg)  BMI 28.26 kg/m2  SpO2 100%  Physical Exam  Nursing note and vitals reviewed. Constitutional: She is oriented to person, place, and time.  She appears well-developed and well-nourished. No distress.  HENT:  Head: Normocephalic and atraumatic.  Mouth/Throat: Oropharynx is clear and moist.  Eyes: Conjunctivae and EOM are normal.  Neck: Normal range of motion. Neck supple.  Tenderness to left paraspinal muscles. No spasm.   Cardiovascular: Normal rate, regular rhythm and normal heart sounds.   Pulmonary/Chest: Effort normal and breath sounds normal. No respiratory distress.  Abdominal: Soft. Bowel sounds are normal. She exhibits no distension. There is no tenderness.  Musculoskeletal: Normal range of motion. She exhibits no edema.  Neurological: She is alert and oriented to person, place, and time. No cranial nerve deficit. She exhibits normal muscle tone. Coordination normal.  Skin: Skin is warm and dry.  Psychiatric: She has a normal mood and affect. Her behavior is normal.    ED Course  Procedures (including critical care time)  DIAGNOSTIC STUDIES: Oxygen Saturation is 100% on room air, normal by my interpretation.    COORDINATION OF CARE: 10:09 PM- Discussed that symptoms are likely muscular in nature. Discussed treatment plan with patient at bedside and patient verbalized agreement.     Labs Review Labs Reviewed - No data to display  Imaging Review No results found.   EKG Interpretation None      MDM   Final diagnoses:  Neck pain  Headache    Patient with complaint of upper back and neck pain that started this morning. No injury. Later developed a left-sided headache. Most of the neck pain is on the left. No fever no infectious symptoms. Not concerned clinically about meningitis at this time. Patient has had the pain in the neck in the past specific cause not known. Will treat with muscle relaxers anti-inflammatory and pain medicine. Cautions provided to return for development of fever any new or worse symptoms. Work note provided.     I personally performed the services described in this  documentation, which was scribed in my presence. The recorded information has been reviewed and is accurate.      Vanetta Mulders, MD 08/23/13 2227

## 2013-08-23 NOTE — Discharge Instructions (Signed)
Take the Naprosyn and Flexeril on a regular basis. Supplement with hydrocodone as needed for additional pain relief. Rest for the next 2 days. Return for any new or worse symptoms. Return for fever or persistent vomiting.

## 2013-08-23 NOTE — ED Notes (Signed)
Pt c/o upper back and neck pain x 12 hrs denies injury

## 2013-10-17 ENCOUNTER — Emergency Department (HOSPITAL_BASED_OUTPATIENT_CLINIC_OR_DEPARTMENT_OTHER)
Admission: EM | Admit: 2013-10-17 | Discharge: 2013-10-17 | Disposition: A | Discharge status: Home / Self Care | Payer: Medicaid Other | Attending: Emergency Medicine | Admitting: Emergency Medicine

## 2013-10-17 ENCOUNTER — Emergency Department (HOSPITAL_BASED_OUTPATIENT_CLINIC_OR_DEPARTMENT_OTHER): Payer: Medicaid Other

## 2013-10-17 ENCOUNTER — Encounter (HOSPITAL_BASED_OUTPATIENT_CLINIC_OR_DEPARTMENT_OTHER): Payer: Self-pay | Admitting: Emergency Medicine

## 2013-10-17 DIAGNOSIS — Z8742 Personal history of other diseases of the female genital tract: Secondary | ICD-10-CM | POA: Diagnosis not present

## 2013-10-17 DIAGNOSIS — N760 Acute vaginitis: Secondary | ICD-10-CM | POA: Insufficient documentation

## 2013-10-17 DIAGNOSIS — Z880 Allergy status to penicillin: Secondary | ICD-10-CM | POA: Diagnosis not present

## 2013-10-17 DIAGNOSIS — Z3202 Encounter for pregnancy test, result negative: Secondary | ICD-10-CM | POA: Insufficient documentation

## 2013-10-17 DIAGNOSIS — A499 Bacterial infection, unspecified: Secondary | ICD-10-CM | POA: Insufficient documentation

## 2013-10-17 DIAGNOSIS — B9689 Other specified bacterial agents as the cause of diseases classified elsewhere: Secondary | ICD-10-CM | POA: Diagnosis not present

## 2013-10-17 DIAGNOSIS — Y849 Medical procedure, unspecified as the cause of abnormal reaction of the patient, or of later complication, without mention of misadventure at the time of the procedure: Secondary | ICD-10-CM | POA: Diagnosis not present

## 2013-10-17 DIAGNOSIS — N9489 Other specified conditions associated with female genital organs and menstrual cycle: Secondary | ICD-10-CM | POA: Diagnosis present

## 2013-10-17 DIAGNOSIS — T8389XA Other specified complication of genitourinary prosthetic devices, implants and grafts, initial encounter: Secondary | ICD-10-CM | POA: Insufficient documentation

## 2013-10-17 DIAGNOSIS — T8384XA Pain from genitourinary prosthetic devices, implants and grafts, initial encounter: Secondary | ICD-10-CM

## 2013-10-17 LAB — URINALYSIS, ROUTINE W REFLEX MICROSCOPIC
Bilirubin Urine: NEGATIVE
Glucose, UA: NEGATIVE mg/dL
HGB URINE DIPSTICK: NEGATIVE
Ketones, ur: NEGATIVE mg/dL
LEUKOCYTES UA: NEGATIVE
Nitrite: NEGATIVE
Protein, ur: NEGATIVE mg/dL
SPECIFIC GRAVITY, URINE: 1.02 (ref 1.005–1.030)
UROBILINOGEN UA: 1 mg/dL (ref 0.0–1.0)
pH: 6.5 (ref 5.0–8.0)

## 2013-10-17 LAB — WET PREP, GENITAL
Trich, Wet Prep: NONE SEEN
YEAST WET PREP: NONE SEEN

## 2013-10-17 LAB — PREGNANCY, URINE: PREG TEST UR: NEGATIVE

## 2013-10-17 MED ORDER — TRAMADOL HCL 50 MG PO TABS: 50.0000 mg | ORAL_TABLET | Freq: Four times a day (QID) | ORAL | Status: DC | PRN

## 2013-10-17 MED ORDER — METRONIDAZOLE 500 MG PO TABS: 500.0000 mg | ORAL_TABLET | Freq: Two times a day (BID) | ORAL | Status: DC

## 2013-10-17 MED ORDER — HYDROCODONE-ACETAMINOPHEN 5-325 MG PO TABS
2.0000 | ORAL_TABLET | Freq: Once | ORAL | Status: AC
  Administered 2013-10-17: 2 via ORAL
  Filled 2013-10-17: qty 2

## 2013-10-17 NOTE — ED Provider Notes (Signed)
TIME SEEN: 12:05 PM  CHIEF COMPLAINT: Pelvic pain  HPI: Patient is a 23 year old G5 P3 A2 who presents emergency department with pelvic pain. She states she had a Mirena IUD placed by Dr. Delford Field with regional physicians in [facility] 6 days ago. She states she has had crampy lower abdominal pain since that time but that is significantly intensified today. She states she has been taking her ibuprofen that has been helping with her pain but it did not help this morning. She took 800 mg. She denies any fevers, chills, nausea, vomiting or diarrhea. No dysuria, hematuria, vaginal bleeding or discharge. Her last normal menstrual period was in April 2015. She states she was out having periods because she had Implanon.  She denies being sexually active. She has had a prior history of Chlamydia that has been treated.  ROS: See HPI Constitutional: no fever  Eyes: no drainage  ENT: no runny nose   Cardiovascular:  no chest pain  Resp: no SOB  GI: no vomiting GU: no dysuria Integumentary: no rash  Allergy: no hives  Musculoskeletal: no leg swelling  Neurological: no slurred speech ROS otherwise negative  PAST MEDICAL HISTORY/PAST SURGICAL HISTORY:  Past Medical History  Diagnosis Date  . Ovarian cyst     MEDICATIONS:  Prior to Admission medications   Medication Sig Start Date End Date Taking? Authorizing Provider  cyclobenzaprine (FLEXERIL) 10 MG tablet Take 1 tablet (10 mg total) by mouth 2 (two) times daily as needed for muscle spasms. 08/23/13   Vanetta Mulders, MD  HYDROcodone-acetaminophen (NORCO/VICODIN) 5-325 MG per tablet Take 1-2 tablets by mouth every 6 (six) hours as needed for moderate pain. 08/23/13   Vanetta Mulders, MD  ibuprofen (ADVIL,MOTRIN) 800 MG tablet Take 1 tablet (800 mg total) by mouth 3 (three) times daily. 03/19/13   April K Palumbo-Rasch, MD  naproxen (NAPROSYN) 500 MG tablet Take 1 tablet (500 mg total) by mouth 2 (two) times daily. 08/23/13   Vanetta Mulders, MD     ALLERGIES:  Allergies  Allergen Reactions  . Penicillins Rash    SOCIAL HISTORY:  History  Substance Use Topics  . Smoking status: Never Smoker   . Smokeless tobacco: Not on file  . Alcohol Use: Yes    FAMILY HISTORY: No family history on file.  EXAM: BP 137/97  Pulse 70  Temp(Src) 98.3 F (36.8 C) (Oral)  Resp 20  Wt 193 lb (87.544 kg)  SpO2 100%  LMP 07/18/2013 CONSTITUTIONAL: Alert and oriented and responds appropriately to questions. Well-appearing; well-nourished HEAD: Normocephalic EYES: Conjunctivae clear, PERRL ENT: normal nose; no rhinorrhea; moist mucous membranes; pharynx without lesions noted NECK: Supple, no meningismus, no LAD  CARD: RRR; S1 and S2 appreciated; no murmurs, no clicks, no rubs, no gallops RESP: Normal chest excursion without splinting or tachypnea; breath sounds clear and equal bilaterally; no wheezes, no rhonchi, no rales,  ABD/GI: Normal bowel sounds; non-distended; soft, non-tender, no rebound, no guarding, no peritoneal signs GU: Normal external genitalia, patient has IUD strings visible from the cervical os, there is mild amount of blood and mucus-like discharge coming from the cervical os, no adnexal tenderness or fullness, patient does not have any cervical motion tenderness, no pain with palpation of her uterus BACK:  The back appears normal and is non-tender to palpation, there is no CVA tenderness EXT: Normal ROM in all joints; non-tender to palpation; no edema; normal capillary refill; no cyanosis    SKIN: Normal color for age and race; warm NEURO: Moves  all extremities equally PSYCH: The patient's mood and manner are appropriate. Grooming and personal hygiene are appropriate.  MEDICAL DECISION MAKING: Discussed with patient that her pain is likely from cramping from having her IUD place. Less likely endometritis or uterine perforation. She has no adnexal tenderness to suggest ectopic pregnancy, torsion or ovarian cyst. We'll send  urinalysis, urine pregnancy test, pelvic cultures, TVUS. We'll give pain medication and reassess.  ED PROGRESS: Pelvic ultrasound unremarkable other than heterogeneous myometrial echotexture suggesting adenomyosis. IUD is in appropriate position. Patient does have clue cells on her wet prep. Will treat with Flagyl for BV. Have discussed with patient that if symptoms continue, worsen or she develops fever, vaginal bleeding or discharge, to return to see her OB/GYN. Patient verbalizes understanding is comfortable with plan.     Layla MawKristen N Abby Stines, DO 10/17/13 1322

## 2013-10-17 NOTE — Discharge Instructions (Signed)
Intrauterine Device Insertion, Care After Refer to this sheet in the next few weeks. These instructions provide you with information on caring for yourself after your procedure. Your health care provider may also give you more specific instructions. Your treatment has been planned according to current medical practices, but problems sometimes occur. Call your health care provider if you have any problems or questions after your procedure. WHAT TO EXPECT AFTER THE PROCEDURE Insertion of the IUD may cause some discomfort, such as cramping. The cramping should improve after the IUD is in place. You may have bleeding after the procedure. This is normal. It varies from light spotting for a few days to menstrual-like bleeding. When the IUD is in place, a string will extend past the cervix into the vagina for 1-2 inches. The strings should not bother you or your partner. If they do, talk to your health care provider.  HOME CARE INSTRUCTIONS   Check your intrauterine device (IUD) to make sure it is in place before you resume sexual activity. You should be able to feel the strings. If you cannot feel the strings, something may be wrong. The IUD may have fallen out of the uterus, or the uterus may have been punctured (perforated) during placement. Also, if the strings are getting longer, it may mean that the IUD is being forced out of the uterus. You no longer have full protection from pregnancy if any of these problems occur.  You may resume sexual intercourse if you are not having problems with the IUD. The copper IUD is considered immediately effective, and the hormone IUD works right away if inserted within 7 days of your period starting. You will need to use a backup method of birth control for 7 days if the IUD in inserted at any other time in your cycle.  Continue to check that the IUD is still in place by feeling for the strings after every menstrual period.  You may need to take pain medicine such as  acetaminophen or ibuprofen. Only take medicines as directed by your health care provider. SEEK MEDICAL CARE IF:   You have bleeding that is heavier or lasts longer than a normal menstrual cycle.  You have a fever.  You have increasing cramps or abdominal pain not relieved with medicine.  You have abdominal pain that does not seem to be related to the same area of earlier cramping and pain.  You are lightheaded, unusually weak, or faint.  You have abnormal vaginal discharge or smells.  You have pain during sexual intercourse.  You cannot feel the IUD strings, or the IUD string has gotten longer.  You feel the IUD at the opening of the cervix in the vagina.  You think you are pregnant, or you miss your menstrual period.  The IUD string is hurting your sex partner. MAKE SURE YOU:  Understand these instructions.  Will watch your condition.  Will get help right away if you are not doing well or get worse. Document Released: 11/10/2010 Document Revised: 01/02/2013 Document Reviewed: 09/02/2012 [facility] Patient Information 2015 [place], [place]. This information is not intended to replace advice given to you by your health care provider. Make sure you discuss any questions you have with your health care provider.    Bacterial Vaginosis Bacterial vaginosis is a vaginal infection that occurs when the normal balance of bacteria in the vagina is disrupted. It results from an overgrowth of certain bacteria. This is the most common vaginal infection in women of childbearing age.  Treatment is important to prevent complications, especially in pregnant women, as it can cause a premature delivery. CAUSES  Bacterial vaginosis is caused by an increase in harmful bacteria that are normally present in smaller amounts in the vagina. Several different kinds of bacteria can cause bacterial vaginosis. However, the reason that the condition develops is not fully understood. RISK FACTORS Certain  activities or behaviors can put you at an increased risk of developing bacterial vaginosis, including:  Having a new sex partner or multiple sex partners.  Douching.  Using an intrauterine device (IUD) for contraception. Women do not get bacterial vaginosis from toilet seats, bedding, swimming pools, or contact with objects around them. SIGNS AND SYMPTOMS  Some women with bacterial vaginosis have no signs or symptoms. Common symptoms include:  Grey vaginal discharge.  A fishlike odor with discharge, especially after sexual intercourse.  Itching or burning of the vagina and vulva.  Burning or pain with urination. DIAGNOSIS  Your health care provider will take a medical history and examine the vagina for signs of bacterial vaginosis. A sample of vaginal fluid may be taken. Your health care provider will look at this sample under a microscope to check for bacteria and abnormal cells. A vaginal pH test may also be done.  TREATMENT  Bacterial vaginosis may be treated with antibiotic medicines. These may be given in the form of a pill or a vaginal cream. A second round of antibiotics may be prescribed if the condition comes back after treatment.  HOME CARE INSTRUCTIONS   Only take over-the-counter or prescription medicines as directed by your health care provider.  If antibiotic medicine was prescribed, take it as directed. Make sure you finish it even if you start to feel better.  Do not have sex until treatment is completed.  Tell all sexual partners that you have a vaginal infection. They should see their health care provider and be treated if they have problems, such as a mild rash or itching.  Practice safe sex by using condoms and only having one sex partner. SEEK MEDICAL CARE IF:   Your symptoms are not improving after 3 days of treatment.  You have increased discharge or pain.  You have a fever. MAKE SURE YOU:   Understand these instructions.  Will watch your  condition.  Will get help right away if you are not doing well or get worse. FOR MORE INFORMATION  Centers for Disease Control and Prevention, Division of STD Prevention: SolutionApps.co.zawww.cdc.gov/std American Sexual Health Association (ASHA): www.ashastd.org  Document Released: 03/14/2005 Document Revised: 01/02/2013 Document Reviewed: 10/24/2012 [facility] Patient Information 2015 [place]. This information is not intended to replace advice given to you by your health care provider. Make sure you discuss any questions you have with your health care provider.

## 2013-10-17 NOTE — ED Notes (Signed)
States she has pelvic pain since MD did IUD procedure on Friday.  Pain is worse today. Rates 10/10.

## 2013-10-17 NOTE — ED Notes (Signed)
Patient transported to Ultrasound 

## 2013-10-18 LAB — GC/CHLAMYDIA PROBE AMP
CT Probe RNA: NEGATIVE
GC Probe RNA: NEGATIVE

## 2014-03-10 ENCOUNTER — Emergency Department (HOSPITAL_BASED_OUTPATIENT_CLINIC_OR_DEPARTMENT_OTHER)
Admission: EM | Admit: 2014-03-10 | Discharge: 2014-03-10 | Disposition: A | Discharge status: Home / Self Care | Payer: Medicaid Other | Attending: Emergency Medicine | Admitting: Emergency Medicine

## 2014-03-10 ENCOUNTER — Encounter (HOSPITAL_BASED_OUTPATIENT_CLINIC_OR_DEPARTMENT_OTHER): Payer: Self-pay

## 2014-03-10 DIAGNOSIS — Z791 Long term (current) use of non-steroidal anti-inflammatories (NSAID): Secondary | ICD-10-CM | POA: Diagnosis not present

## 2014-03-10 DIAGNOSIS — M452 Ankylosing spondylitis of cervical region: Secondary | ICD-10-CM | POA: Diagnosis not present

## 2014-03-10 DIAGNOSIS — M25511 Pain in right shoulder: Secondary | ICD-10-CM

## 2014-03-10 DIAGNOSIS — M25512 Pain in left shoulder: Secondary | ICD-10-CM | POA: Diagnosis not present

## 2014-03-10 DIAGNOSIS — M542 Cervicalgia: Secondary | ICD-10-CM

## 2014-03-10 DIAGNOSIS — Z79899 Other long term (current) drug therapy: Secondary | ICD-10-CM | POA: Diagnosis not present

## 2014-03-10 DIAGNOSIS — M79602 Pain in left arm: Secondary | ICD-10-CM | POA: Diagnosis present

## 2014-03-10 DIAGNOSIS — Z8742 Personal history of other diseases of the female genital tract: Secondary | ICD-10-CM | POA: Diagnosis not present

## 2014-03-10 DIAGNOSIS — Z880 Allergy status to penicillin: Secondary | ICD-10-CM | POA: Diagnosis not present

## 2014-03-10 MED ORDER — NAPROXEN 500 MG PO TABS: 500.0000 mg | ORAL_TABLET | Freq: Two times a day (BID) | ORAL | Status: DC

## 2014-03-10 NOTE — ED Provider Notes (Addendum)
CSN: [id number]     Arrival date & time 03/10/14  0818 History   First MD Initiated Contact with Patient 03/10/14 [phone]     Chief Complaint  Patient presents with  . Arm Pain     (Consider location/radiation/quality/duration/timing/severity/associated sxs/prior Treatment) The history is provided by the patient.   patient with onset of some right-sided neck pain shoulder pain that radiates down into her fingers with tingling that started yesterday. No specific injury. Improves some with Motrin yesterday but is still present. No history of similar findings. Pain is increased some with movement at the right shoulder. Patient states that the pain is 6 out of 10. Sharp and aching nature. No other complaints.  Past Medical History  Diagnosis Date  . Ovarian cyst    History reviewed. No pertinent past surgical history. No family history on file. History  Substance Use Topics  . Smoking status: Never Smoker   . Smokeless tobacco: Not on file  . Alcohol Use: Yes   OB History    No data available     Review of Systems  Constitutional: Negative for fever.  HENT: Negative for congestion.   Eyes: Negative for visual disturbance.  Respiratory: Negative for shortness of breath.   Cardiovascular: Negative for chest pain.  Gastrointestinal: Negative for abdominal pain.  Genitourinary: Negative for dysuria.  Musculoskeletal: Positive for neck pain. Negative for back pain.  Skin: Negative for rash.  Neurological: Positive for numbness. Negative for weakness and headaches.  Hematological: Does not bruise/bleed easily.  Psychiatric/Behavioral: Negative for confusion.      Allergies  Penicillins  Home Medications   Prior to Admission medications   Medication Sig Start Date End Date Taking? Authorizing Provider  cyclobenzaprine (FLEXERIL) 10 MG tablet Take 1 tablet (10 mg total) by mouth 2 (two) times daily as needed for muscle spasms. 08/23/13   Vanetta MuldersScott Akif Weldy, MD   HYDROcodone-acetaminophen (NORCO/VICODIN) 5-325 MG per tablet Take 1-2 tablets by mouth every 6 (six) hours as needed for moderate pain. 08/23/13   Vanetta MuldersScott Orion Vandervort, MD  ibuprofen (ADVIL,MOTRIN) 800 MG tablet Take 1 tablet (800 mg total) by mouth 3 (three) times daily. 03/19/13   April K Palumbo-Rasch, MD  metroNIDAZOLE (FLAGYL) 500 MG tablet Take 1 tablet (500 mg total) by mouth 2 (two) times daily. 10/17/13   Kristen N Ward, DO  naproxen (NAPROSYN) 500 MG tablet Take 1 tablet (500 mg total) by mouth 2 (two) times daily. 03/10/14   Vanetta MuldersScott Karolyne Timmons, MD  traMADol (ULTRAM) 50 MG tablet Take 1 tablet (50 mg total) by mouth every 6 (six) hours as needed. 10/17/13   Kristen N Ward, DO   BP 139/92 mmHg  Pulse 74  Temp(Src) 98.7 F (37.1 C) (Oral)  Resp 18  Ht 5\' 10"  (1.778 m)  Wt 185 lb (83.915 kg)  BMI 26.54 kg/m2  SpO2 100% Physical Exam  Constitutional: She is oriented to person, place, and time. She appears well-developed and well-nourished. No distress.  HENT:  Head: Normocephalic and atraumatic.  Mouth/Throat: Oropharynx is clear and moist.  Eyes: Conjunctivae and EOM are normal. Pupils are equal, round, and reactive to light.  Neck: Normal range of motion.  Cardiovascular: Normal rate, regular rhythm and normal heart sounds.   No murmur heard. Pulmonary/Chest: Effort normal and breath sounds normal. No respiratory distress.  Abdominal: Soft. Bowel sounds are normal. There is no tenderness.  Musculoskeletal: She exhibits no edema or tenderness.  Mild pain with range of motion of the right shoulder. However patient able  to raise arm above head. Radial pulses 2+. Sensation to the fingers grossly intact. Good range of motion at the fingers wrist and elbow. Some mild tenderness to palpation along the trapezius muscle on the right side.  Neurological: She is alert and oriented to person, place, and time. No cranial nerve deficit. She exhibits normal muscle tone. Coordination normal.  Skin: Skin  is warm. No rash noted.  Nursing note and vitals reviewed.   ED Course  Procedures (including critical care time) Labs Review Labs Reviewed - No data to display  Imaging Review No results found.   EKG Interpretation None      MDM   Final diagnoses:  Neck pain  Shoulder pain, acute, right    Patient without any focal neuro deficit. Some tingling to the fingers. Suggestive of either shoulder pain or most likely the pain with a little bit of nerve irritation but no significant motor or sensory deficits. No specific injury. Will treat with anti-inflammatories and rest.    Vanetta MuldersScott Jaimarie Rapozo, MD 03/10/14 [id number]  Vanetta MuldersScott Tomislav Micale, MD 03/10/14 [phone]

## 2014-03-10 NOTE — ED Notes (Signed)
Reports neck, right shoulder and arm pain since yesterday. Denies injury.

## 2014-03-10 NOTE — Discharge Instructions (Signed)
Take the Naprosyn as directed. Rest the right arm is much as possible. Work note provided. If not improved in 7 days follow-up. Return for any new or worse symptoms.

## 2014-03-10 NOTE — ED Notes (Signed)
MD at bedside. 

## 2014-03-24 ENCOUNTER — Encounter (HOSPITAL_BASED_OUTPATIENT_CLINIC_OR_DEPARTMENT_OTHER): Payer: Self-pay

## 2014-03-24 ENCOUNTER — Emergency Department (HOSPITAL_BASED_OUTPATIENT_CLINIC_OR_DEPARTMENT_OTHER)
Admission: EM | Admit: 2014-03-24 | Discharge: 2014-03-24 | Disposition: A | Discharge status: Home / Self Care | Payer: Medicaid Other | Attending: Emergency Medicine | Admitting: Emergency Medicine

## 2014-03-24 DIAGNOSIS — Z791 Long term (current) use of non-steroidal anti-inflammatories (NSAID): Secondary | ICD-10-CM | POA: Insufficient documentation

## 2014-03-24 DIAGNOSIS — Z720 Tobacco use: Secondary | ICD-10-CM | POA: Insufficient documentation

## 2014-03-24 DIAGNOSIS — J018 Other acute sinusitis: Secondary | ICD-10-CM

## 2014-03-24 DIAGNOSIS — J029 Acute pharyngitis, unspecified: Secondary | ICD-10-CM | POA: Diagnosis present

## 2014-03-24 DIAGNOSIS — Z880 Allergy status to penicillin: Secondary | ICD-10-CM | POA: Diagnosis not present

## 2014-03-24 DIAGNOSIS — Z8742 Personal history of other diseases of the female genital tract: Secondary | ICD-10-CM | POA: Diagnosis not present

## 2014-03-24 DIAGNOSIS — Z792 Long term (current) use of antibiotics: Secondary | ICD-10-CM | POA: Insufficient documentation

## 2014-03-24 LAB — RAPID STREP SCREEN (MED CTR MEBANE ONLY): STREPTOCOCCUS, GROUP A SCREEN (DIRECT): NEGATIVE

## 2014-03-24 MED ORDER — DOXYCYCLINE HYCLATE 100 MG PO CAPS: 100.0000 mg | ORAL_CAPSULE | Freq: Two times a day (BID) | ORAL | Status: DC

## 2014-03-24 MED ORDER — SALINE SPRAY 0.65 % NA SOLN: 1.0000 | NASAL | Status: DC | PRN

## 2014-03-24 NOTE — ED Provider Notes (Signed)
CSN: [id number]     Arrival date & time 03/24/14  1619 History   First MD Initiated Contact with Patient 03/24/14 1754     Chief Complaint  Patient presents with  . Sore Throat     (Consider location/radiation/quality/duration/timing/severity/associated sxs/prior Treatment) HPI Ms. Anna Koch is a 23 year old female who presents the ER with complaint of sinus problems the past 3 weeks. Patient states her symptoms began with sinus congestion and drainage and sore throat. Patient states her symptoms were improving, however over the past week her symptoms have worsened. Patient states she is experiencing more sinus congestion, fullness which she describes the headache in her face, and patient also describes a sore throat and cough. Patient denies fever, neck pain, neck stiffness, dizziness, blurred vision, shortness of breath, chest pain, palpitations, abdominal pain, nausea, vomiting, diarrhea.  Past Medical History  Diagnosis Date  . Ovarian cyst    History reviewed. No pertinent past surgical history. No family history on file. History  Substance Use Topics  . Smoking status: Current Some Day Smoker  . Smokeless tobacco: Not on file  . Alcohol Use: Yes   OB History    No data available     Review of Systems  Constitutional: Negative for fever.  HENT: Positive for congestion and sore throat. Negative for trouble swallowing and voice change.   Eyes: Negative for visual disturbance.  Respiratory: Positive for cough. Negative for shortness of breath and wheezing.   Cardiovascular: Negative for chest pain.  Gastrointestinal: Negative for nausea, vomiting and abdominal pain.  Genitourinary: Negative for dysuria.  Skin: Negative for rash.  Neurological: Negative for dizziness, syncope, weakness and numbness.  Psychiatric/Behavioral: Negative.       Allergies  Penicillins  Home Medications   Prior to Admission medications   Medication Sig Start Date End Date Taking?  Authorizing Provider  cyclobenzaprine (FLEXERIL) 10 MG tablet Take 1 tablet (10 mg total) by mouth 2 (two) times daily as needed for muscle spasms. 08/23/13   Vanetta MuldersScott Zackowski, MD  doxycycline (VIBRAMYCIN) 100 MG capsule Take 1 capsule (100 mg total) by mouth 2 (two) times daily. One po bid x 7 days 03/24/14   Monte FantasiaJoseph W Mitul Hallowell, PA-C  HYDROcodone-acetaminophen (NORCO/VICODIN) 5-325 MG per tablet Take 1-2 tablets by mouth every 6 (six) hours as needed for moderate pain. 08/23/13   Vanetta MuldersScott Zackowski, MD  ibuprofen (ADVIL,MOTRIN) 800 MG tablet Take 1 tablet (800 mg total) by mouth 3 (three) times daily. 03/19/13   April K Palumbo-Rasch, MD  metroNIDAZOLE (FLAGYL) 500 MG tablet Take 1 tablet (500 mg total) by mouth 2 (two) times daily. 10/17/13   Kristen N Ward, DO  naproxen (NAPROSYN) 500 MG tablet Take 1 tablet (500 mg total) by mouth 2 (two) times daily. 03/10/14   Vanetta MuldersScott Zackowski, MD  sodium chloride (OCEAN) 0.65 % SOLN nasal spray Place 1 spray into both nostrils as needed for congestion. 03/24/14   Monte FantasiaJoseph W Faiza Bansal, PA-C  traMADol (ULTRAM) 50 MG tablet Take 1 tablet (50 mg total) by mouth every 6 (six) hours as needed. 10/17/13   Kristen N Ward, DO   BP 146/96 mmHg  Pulse 92  Temp(Src) 99 F (37.2 C) (Oral)  Resp 16  Ht 5\' 6"  (1.676 m)  Wt 180 lb (81.647 kg)  BMI 29.07 kg/m2  SpO2 100% Physical Exam  Constitutional: She is oriented to person, place, and time. She appears well-developed and well-nourished. No distress.  HENT:  Head: Normocephalic and atraumatic.  Right Ear: Tympanic membrane and external  ear normal. No drainage. No decreased hearing is noted.  Left Ear: Tympanic membrane and external ear normal. No drainage. No decreased hearing is noted.  Nose: Right sinus exhibits no frontal sinus tenderness. Left sinus exhibits no frontal sinus tenderness.  Mouth/Throat: Uvula is midline and mucous membranes are normal. No oral lesions. No trismus in the jaw. No dental abscesses or uvula swelling.  Posterior oropharyngeal erythema present. No oropharyngeal exudate, posterior oropharyngeal edema or tonsillar abscesses.  Mild swelling to nasal turbinates bilaterally with purulent discharge. Mild to moderate frontal sinus tenderness, which patient states consistent and reproduces her headache. Oropharynx with mild amount of erythema. No exudates or tonsillar swelling noted. No trismus, signs of PTA.  Eyes: Right eye exhibits no discharge. Left eye exhibits no discharge. No scleral icterus.  Neck: Normal range of motion.  Pulmonary/Chest: Effort normal. No respiratory distress.  Musculoskeletal: Normal range of motion.  Neurological: She is alert and oriented to person, place, and time.  Skin: Skin is warm and dry. She is not diaphoretic.  Psychiatric: She has a normal mood and affect.  Nursing note and vitals reviewed.   ED Course  Procedures (including critical care time) Labs Review Labs Reviewed  RAPID STREP SCREEN  CULTURE, GROUP A STREP    Imaging Review No results found.   EKG Interpretation None      MDM   Final diagnoses:  Other acute sinusitis    Nasal congestion and cough for 3 weeks worsening last week. No fever. Patient well-appearing with exam benign, maxillary and frontal sinus tenderness. Sore throat non-concerning for PTA  Patient complaining of symptoms of sinusitis.    Severe symptoms have been present for greater than 10 days with purulent nasal discharge and maxillary sinus pain.  Concern for acute bacterial rhinosinusitis.  Patient discharged with doxycycline due to penicillin allergy.  Instructions given for warm saline nasal wash and recommendations for follow-up with primary care physician.  I discussed return precautions with patient, and encourage her to call or return to the ER should she have any questions or concerns.  BP 146/96 mmHg  Pulse 92  Temp(Src) 99 F (37.2 C) (Oral)  Resp 16  Ht 5\' 6"  (1.676 m)  Wt 180 lb (81.647 kg)  BMI 29.07  kg/m2  SpO2 100%  Signed,  Anna MowJoe Mattilyn Crites, PA-C 1:27 AM      Monte FantasiaJoseph W Lacey Wallman, PA-C 03/25/14 0127  Anna Shiobert L Beaton, MD 03/26/14 [phone]

## 2014-03-24 NOTE — ED Notes (Signed)
Pt reports been sick with sinus problems on and off for 3 weeks.  Reports now had a bad headache and sore throat.  Pt complains of generalized body aches.  Denies fever.

## 2014-03-24 NOTE — Discharge Instructions (Signed)
Continue using over-the-counter cold/flu therapies. Follow-up with your primary doctor, refer to resource guide below if you need help finding one. Return to the ER if any worsening of symptoms, high fever, difficulty breathing, chest pain, severe headache.  Sinusitis Sinusitis is redness, soreness, and inflammation of the paranasal sinuses. Paranasal sinuses are air pockets within the bones of your face (beneath the eyes, the middle of the forehead, or above the eyes). In healthy paranasal sinuses, mucus is able to drain out, and air is able to circulate through them by way of your nose. However, when your paranasal sinuses are inflamed, mucus and air can become trapped. This can allow bacteria and other germs to grow and cause infection. Sinusitis can develop quickly and last only a short time (acute) or continue over a long period (chronic). Sinusitis that lasts for more than 12 weeks is considered chronic.  CAUSES  Causes of sinusitis include:  Allergies.  Structural abnormalities, such as displacement of the cartilage that separates your nostrils (deviated septum), which can decrease the air flow through your nose and sinuses and affect sinus drainage.  Functional abnormalities, such as when the small hairs (cilia) that line your sinuses and help remove mucus do not work properly or are not present. SIGNS AND SYMPTOMS  Symptoms of acute and chronic sinusitis are the same. The primary symptoms are pain and pressure around the affected sinuses. Other symptoms include:  Upper toothache.  Earache.  Headache.  Bad breath.  Decreased sense of smell and taste.  A cough, which worsens when you are lying flat.  Fatigue.  Fever.  Thick drainage from your nose, which often is green and may contain pus (purulent).  Swelling and warmth over the affected sinuses. DIAGNOSIS  Your health care provider will perform a physical exam. During the exam, your health care provider may:  Look in  your nose for signs of abnormal growths in your nostrils (nasal polyps).  Tap over the affected sinus to check for signs of infection.  View the inside of your sinuses (endoscopy) using an imaging device that has a light attached (endoscope). If your health care provider suspects that you have chronic sinusitis, one or more of the following tests may be recommended:  Allergy tests.  Nasal culture. A sample of mucus is taken from your nose, sent to a lab, and screened for bacteria.  Nasal cytology. A sample of mucus is taken from your nose and examined by your health care provider to determine if your sinusitis is related to an allergy. TREATMENT  Most cases of acute sinusitis are related to a viral infection and will resolve on their own within 10 days. Sometimes medicines are prescribed to help relieve symptoms (pain medicine, decongestants, nasal steroid sprays, or saline sprays).  However, for sinusitis related to a bacterial infection, your health care provider will prescribe antibiotic medicines. These are medicines that will help kill the bacteria causing the infection.  Rarely, sinusitis is caused by a fungal infection. In theses cases, your health care provider will prescribe antifungal medicine. For some cases of chronic sinusitis, surgery is needed. Generally, these are cases in which sinusitis recurs more than 3 times per year, despite other treatments. HOME CARE INSTRUCTIONS   Drink plenty of water. Water helps thin the mucus so your sinuses can drain more easily.  Use a humidifier.  Inhale steam 3 to 4 times a day (for example, sit in the bathroom with the shower running).  Apply a warm, moist washcloth to your  face 3 to 4 times a day, or as directed by your health care provider.  Use saline nasal sprays to help moisten and clean your sinuses.  Take medicines only as directed by your health care provider.  If you were prescribed either an antibiotic or antifungal medicine,  finish it all even if you start to feel better. SEEK IMMEDIATE MEDICAL CARE IF:  You have increasing pain or severe headaches.  You have nausea, vomiting, or drowsiness.  You have swelling around your face.  You have vision problems.  You have a stiff neck.  You have difficulty breathing. MAKE SURE YOU:   Understand these instructions.  Will watch your condition.  Will get help right away if you are not doing well or get worse. Document Released: 03/14/2005 Document Revised: 07/29/2013 Document Reviewed: 03/29/2011 [facility] Patient Information 2015 [place] [employer] [place]. This information is not intended to replace advice given to you by your health care provider. Make sure you discuss any questions you have with your health care provider.   Emergency Department Resource Guide 1) Find a Doctor and Pay Out of Pocket Although you won't have to find out who is covered by your insurance plan, it is a good idea to ask around and get recommendations. You will then need to call the office and see if the doctor you have chosen will accept you as a new patient and what types of options they offer for patients who are self-pay. Some doctors offer discounts or will set up payment plans for their patients who do not have insurance, but you will need to ask so you aren't surprised when you get to your appointment.  2) Contact Your Local Health Department Not all health departments have doctors that can see patients for sick visits, but many do, so it is worth a call to see if yours does. If you don't know where your local health department is, you can check in your phone book. The CDC also has a tool to help you locate your state's health department, and many state websites also have listings of all of their local health departments.  3) Find a Walk-in Clinic If your illness is not likely to be very severe or complicated, you may want to try a walk in clinic. These are popping up all over the country in  pharmacies, drugstores, and shopping centers. They're usually staffed by nurse practitioners or physician assistants that have been trained to treat common illnesses and complaints. They're usually fairly quick and inexpensive. However, if you have serious medical issues or chronic medical problems, these are probably not your best option.  No Primary Care Doctor: - Call Health Connect at  [phone] - they can help you locate a primary care doctor that  accepts your insurance, provides certain services, etc. - Physician Referral Service- [phone]  Chronic Pain Problems: Organization         Address  Phone   Notes  [facility]  [phone] Patients need to be referred by their primary care doctor.   Medication Assistance: Organization         Address  Phone   Notes  [facility] [address] [place], [place] [id number] [phone] --Must be a resident of [place] -- Must have NO insurance coverage whatsoever (no Medicaid/ Medicare, etc.) -- The pt. MUST have a primary care doctor that directs their care regularly and follows them in the community   [facility]ist  (  [phone]   [employer]  [phone]    Agencies that provide inexpensive medical care: Organization         Address  Phone   Notes  [place]  [phone]   [facility]    [phone]   [facility] [place], [place] [address] [phone]   [place] [address] [place] [phone]   Planned Parenthood    [phone]   [facility]    [phone]   [place] and [address], [place] Phone:  [phone], Fax:  [phone] Hours of Operation:  9 am - 6 pm, M-F.  Also accepts Medicaid/Medicare and self-pay.  [facility] for [place] [address], [address],  [place] Phone: [phone], Fax: [phone]. Hours of Operation:  8:30 am - 5:30 pm, M-F.  Also accepts Medicaid and self-pay.  [facility] [address], [place] Phone: [phone]   [place], [place], [place] [phone], Ext. [phone] Mondays & Thursdays: 7-9 AM.  First 15 patients are seen on a first come, first serve basis.    [place] Providers:  Organization         Address  Phone   Notes  [facility] [address], [place] [phone] Also accepts self-pay patients.  [facility] [address], [place]  [phone]   [place], [address], [place] [phone]   [facility] [address], [place] [phone]   Lucianne Lei [address], [place]   [phone] Only accepts [place] Access [place] patients after they have their name applied to their card.   Self-Pay (no insurance) in [facility]:  Organization         Address  Phone   Notes  Sickle Cell Patients, [facility] [place] [phone]   [facility] [place] [phone]   [facility] [place]  [place], [place], [place] [phone]   [facility]/Dr. Osei-Bonsu  [address], [place] or [address], [place] [phone] Phone number for both [place] and [place] locations is the same.  [facility]gent [facility] and [facility] [address], [place] [phone]   [facility] [address], [place] or [address] [phone]   [facility] [address], [place] [phone], phone; [phone], fax Sees patients 1st and 3rd Saturday of every month.  Must not  qualify for public or private insurance (i.e. Medicaid, Medicare, [place] Health Choice, Veterans' Benefits)  Household income should be no more than 200% of the poverty level The clinic cannot treat you if you are pregnant or think you are pregnant  Sexually transmitted diseases are not treated at the clinic.    Dental Care: Organization         Address  Phone  Notes  [facility] Department of [facility] [place] [phone] Accepts children up to age 82 who are enrolled in [place] or [place]; pregnant women  with a Medicaid card; and children who have applied for Medicaid or [place] Health Choice, but were declined, whose parents can pay a reduced fee at time of service.  [facility] Department of [facility]  [address], [place] [phone] Accepts children up to age 56 who are enrolled in [place] or [place]; pregnant women with a Medicaid card; and children who have applied for Medicaid or [place] Health Choice, but were declined, whose parents can pay a reduced fee at time of service.  [place] Adult Dental Access PROGRAM  [place] [phone] Patients are seen by appointment only. Walk-ins are not accepted. [place] will see patients 81 years of age and older. Monday - Tuesday (8am-5pm) Most Wednesdays (8:30-5pm) $30 per visit, cash only  [facility] Adult Dental Access PROGRAM  [address], [facility] [phone] Patients are seen by appointment only. Walk-ins are not accepted. [place] will see patients 63 years of age and older. One Wednesday Evening (Monthly: Volunteer Based).  $30 per visit, cash only  [place]  [phone] for adults; Children under age 50, call Graduate Pediatric Dentistry at [phone]. Children aged 36-14, please call [phone] to request a pediatric application.  Dental services are provided  in all areas of dental care including fillings, crowns and bridges, complete and partial dentures, implants, gum treatment, root canals, and extractions. Preventive care is also provided. Treatment is provided to both adults and children. Patients are selected via a lottery and there is often a waiting list.   [facility] [address], [place]  [phone] [email or web address]   [facility] [address] [place], [place] [phone], Ext. [phone] Second and Fourth Thursday of each month, opens at 6:30 AM; Clinic ends at 9 AM.  Patients are seen on a first-come first-served basis, and a limited number are seen during each clinic.   [facility]  [address] [place], [place] [phone]   Eligibility Requirements You must have lived in [place], [place], or [place] counties for at least the last three months.   You cannot be eligible for state or federal sponsored [employer], including [employer], [place], or [employer].   You generally cannot be eligible for healthcare insurance through your employer.    How to apply: Eligibility screenings are held every Tuesday and Wednesday afternoon from 1:00 pm until 4:00 pm. You do not need an appointment for the interview!  [facility] [address], [place], [place]   [place] Department  [place]  [phone]    Behavioral Health Resources in the Community: Intensive Outpatient Programs Organization         Address  Phone  Notes  [place] Edgar. [address], [place], [place] [phone]   [facility] Outpatient [address], [place], [place]   ADS: Alcohol & Drug Svcs [address] [place], [place]   [place] [address],  [place], [place] or [phone]   Substance Abuse Resources Organization         Address  Phone  Notes  Alcohol and Drug Services  [phone]   [facility]  [phone]   [facility]   [facility]  [phone]   Residential & Outpatient Substance Abuse Program  [phone]   Psychological Services Organization         Address  Phone  Notes  [facility] [place]  [place]  [phone]   [place] [address], [place] or [phone]    Mobile Crisis Teams Organization         Address  Phone  Notes  [facility], Mobile Crisis Care Unit  [phone]   [facility]  [address] [place], [place]   [place] [address] [place] [phone]    Self-Help/Support Groups Organization         Address  Phone             Notes  [place]. of [place] - variety of support groups  [place] Call for more information  Narcotics Anonymous (NA), [facility] [address], [employer] [place]  2 meetings at this location   Special educational needs teacher         Address  Phone  Notes  [facility] Residential Treatment [place],    [place]  [phone]   [facility]  [address] [place] [phone], [place], [place]   [place], [place] [phone] Admissions: 8am-3pm M-F  Incentives Substance [place] [address],    [place], [place] [phone]   [facility] [address] [place], [place], [place]   [facility] (2-Rh) [address],  [place], [place]   [facility] - Intensive Outpatient [address]een [address], [place], [place]   [facility] (Rew.) [place].,  [place], [place] [phone] or  [phone]   [facility] Services (RTS) [address] [place], [place] Accepts Medicaid  [facility] [address]  [place] [phone] Substance Abuse/Addiction Treatment   [facility] Organization         Address  Phone  Notes  [facility]  [phone]   Domenic Schwab, PhD [address] Arlis Porta [place], [place]   [phone] or [phone]   [place], [place] [phone]   [facility] [address], [place], [place] [phone] Insurance/Medicaid/sponsorship through [facility] and Families [address] [place]                                    [place], [place] [phone] [place] [address], [place] [phone]    Dr. Adele Schilder  [phone]   [facility] Dept. 1) [address] [place] 2) [place] 3)  [place] [address], [place] [phone]  [phone]   [place] [phone] or [phone] (After Hours)

## 2014-03-26 LAB — CULTURE, GROUP A STREP

## 2014-07-28 ENCOUNTER — Emergency Department (HOSPITAL_BASED_OUTPATIENT_CLINIC_OR_DEPARTMENT_OTHER)
Admission: EM | Admit: 2014-07-28 | Discharge: 2014-07-28 | Discharge status: Against Medical Advice | Payer: 59 | Attending: Emergency Medicine | Admitting: Emergency Medicine

## 2014-07-28 ENCOUNTER — Encounter (HOSPITAL_BASED_OUTPATIENT_CLINIC_OR_DEPARTMENT_OTHER): Payer: Self-pay | Admitting: *Deleted

## 2014-07-28 ENCOUNTER — Encounter (HOSPITAL_BASED_OUTPATIENT_CLINIC_OR_DEPARTMENT_OTHER): Payer: Self-pay

## 2014-07-28 DIAGNOSIS — Z720 Tobacco use: Secondary | ICD-10-CM | POA: Insufficient documentation

## 2014-07-28 DIAGNOSIS — R6883 Chills (without fever): Secondary | ICD-10-CM | POA: Insufficient documentation

## 2014-07-28 DIAGNOSIS — M791 Myalgia: Secondary | ICD-10-CM | POA: Insufficient documentation

## 2014-07-28 DIAGNOSIS — R0602 Shortness of breath: Secondary | ICD-10-CM | POA: Insufficient documentation

## 2014-07-28 DIAGNOSIS — R0981 Nasal congestion: Secondary | ICD-10-CM | POA: Insufficient documentation

## 2014-07-28 DIAGNOSIS — R52 Pain, unspecified: Secondary | ICD-10-CM | POA: Insufficient documentation

## 2014-07-28 NOTE — ED Notes (Signed)
C/o body aches, nasal congestion,chills, SOB-s/s started 3am

## 2014-07-28 NOTE — ED Notes (Signed)
Pt NAD.

## 2014-07-28 NOTE — ED Notes (Signed)
Pt c/o flu like symptoms x 1 day

## 2014-12-23 ENCOUNTER — Encounter (HOSPITAL_BASED_OUTPATIENT_CLINIC_OR_DEPARTMENT_OTHER): Payer: Self-pay | Admitting: Adult Health

## 2014-12-23 ENCOUNTER — Emergency Department (HOSPITAL_BASED_OUTPATIENT_CLINIC_OR_DEPARTMENT_OTHER)
Admission: EM | Admit: 2014-12-23 | Discharge: 2014-12-23 | Disposition: A | Discharge status: Home / Self Care | Payer: 59 | Attending: Emergency Medicine | Admitting: Emergency Medicine

## 2014-12-23 DIAGNOSIS — E669 Obesity, unspecified: Secondary | ICD-10-CM | POA: Insufficient documentation

## 2014-12-23 DIAGNOSIS — M436 Torticollis: Secondary | ICD-10-CM | POA: Diagnosis not present

## 2014-12-23 DIAGNOSIS — Z880 Allergy status to penicillin: Secondary | ICD-10-CM | POA: Insufficient documentation

## 2014-12-23 DIAGNOSIS — Z8742 Personal history of other diseases of the female genital tract: Secondary | ICD-10-CM | POA: Diagnosis not present

## 2014-12-23 DIAGNOSIS — Z720 Tobacco use: Secondary | ICD-10-CM | POA: Diagnosis not present

## 2014-12-23 DIAGNOSIS — M542 Cervicalgia: Secondary | ICD-10-CM | POA: Diagnosis present

## 2014-12-23 MED ORDER — NAPROXEN 500 MG PO TABS: 500.0000 mg | ORAL_TABLET | Freq: Two times a day (BID) | ORAL | Status: DC

## 2014-12-23 MED ORDER — NAPROXEN 250 MG PO TABS
500.0000 mg | ORAL_TABLET | Freq: Once | ORAL | Status: AC
  Administered 2014-12-23: 500 mg via ORAL
  Filled 2014-12-23: qty 2

## 2014-12-23 MED ORDER — METHOCARBAMOL 500 MG PO TABS: 500.0000 mg | ORAL_TABLET | Freq: Two times a day (BID) | ORAL | Status: DC

## 2014-12-23 NOTE — Discharge Instructions (Signed)

## 2014-12-23 NOTE — ED Provider Notes (Signed)
CSN: [id number]     Arrival date & time 12/23/14  2048 History   First MD Initiated Contact with Patient 12/23/14 2103     Chief Complaint  Patient presents with  . Neck Pain     (Consider location/radiation/quality/duration/timing/severity/associated sxs/prior Treatment) HPI  24 year old female who presents for evaluation of neck pain. Patient reports for the past 2 weeks she has had persistent right-sided neck pain. She described pain as a constant sharp sensation radiating from the right side of neck down to her right upper arm, 8 out of 10, worsening with movement. Pain is somewhat improved after she was given Percocets, Flexeril, and anti-inflammatory medication at an urgent care 3 days ago. Symptom has not for resolved. No associated headache, fever, numbness or weakness, or rash. She denies any recent injury. She has had similar neck pain in the past of unknown etiology.         Past Medical History  Diagnosis Date  . Ovarian cyst    History reviewed. No pertinent past surgical history. History reviewed. No pertinent family history. Social History  Substance Use Topics  . Smoking status: Current Some Day Smoker  . Smokeless tobacco: None  . Alcohol Use: Yes   OB History    No data available     Review of Systems  Constitutional: Negative for fever.  Musculoskeletal: Positive for neck pain. Negative for back pain.  Skin: Negative for rash and wound.  Neurological: Negative for numbness and headaches.      Allergies  Penicillins  Home Medications   Prior to Admission medications   Not on File   BP 147/91 mmHg  Pulse 81  Temp(Src) 98.4 F (36.9 C) (Oral)  Resp 18  Ht  (1.676 m)  Wt 202 lb 8 oz (91.853 kg)  BMI 32.70 kg/m2  SpO2 100% Physical Exam  Constitutional: She appears well-developed and well-nourished. No distress.  Obese African-American female sitting up in bed in no acute discomfort.  HENT:  Head: Atraumatic.  Eyes: Conjunctivae are  normal.  Neck: Normal range of motion. Neck supple. No JVD present. No tracheal deviation present.  Cardiovascular: Intact distal pulses.   Musculoskeletal: She exhibits tenderness (Neck: Tenderness to right paracervical muscle and along the right trapezius extending towards right deltoid on palpation. Decreased neck lateral rotation. No nuchal rigidity. No overlying skin changes.).  Lymphadenopathy:    She has no cervical adenopathy.  Neurological: She is alert.  Normal grip strength bilaterally with intact distal radial pulses bilaterally.  Skin: No rash noted.  Psychiatric: She has a normal mood and affect.  Nursing note and vitals reviewed.   ED Course  Procedures (including critical care time)  Patient presents with neck pain ongoing for the past 2 weeks. Symptoms suggestive of acute torticollis. No signs of infection noted no meningismal sign concerning for meningitis. She has had similar neck pain which has been evaluated in the past. Recommend rice therapy continue with NSAIDs, and muscle relaxant. Orthopedic referral given as needed. Return precautions discussed.    MDM   Final diagnoses:  Torticollis, acute   BP 147/91 mmHg  Pulse 81  Temp(Src) 98.4 F (36.9 C) (Oral)  Resp 18  Ht  (1.676 m)  Wt 202 lb 8 oz (91.853 kg)  BMI 32.70 kg/m2  SpO2 100%     Fayrene Helper, PA-C 12/23/14 2150  Lavera Guise, MD 12/24/14 [phone]

## 2014-12-23 NOTE — ED Notes (Signed)
Presents with 2 weeks of right sided neck and shoulder pain described as sharp and throbbing, worse when picking something up or moving head side to side. deneis fevers. Seen at [facility] a few days ago-RX for muscle relaxer given aqnd helps if she takes a pain medication with it, but does not help on its own.

## 2015-10-08 ENCOUNTER — Emergency Department (HOSPITAL_BASED_OUTPATIENT_CLINIC_OR_DEPARTMENT_OTHER): Payer: 59

## 2015-10-08 ENCOUNTER — Encounter (HOSPITAL_BASED_OUTPATIENT_CLINIC_OR_DEPARTMENT_OTHER): Payer: Self-pay | Admitting: Emergency Medicine

## 2015-10-08 ENCOUNTER — Emergency Department (HOSPITAL_BASED_OUTPATIENT_CLINIC_OR_DEPARTMENT_OTHER)
Admission: EM | Admit: 2015-10-08 | Discharge: 2015-10-08 | Disposition: A | Discharge status: Home / Self Care | Payer: 59 | Attending: Physician Assistant | Admitting: Physician Assistant

## 2015-10-08 DIAGNOSIS — F17210 Nicotine dependence, cigarettes, uncomplicated: Secondary | ICD-10-CM | POA: Diagnosis not present

## 2015-10-08 DIAGNOSIS — R1030 Lower abdominal pain, unspecified: Secondary | ICD-10-CM

## 2015-10-08 DIAGNOSIS — N3000 Acute cystitis without hematuria: Secondary | ICD-10-CM | POA: Diagnosis not present

## 2015-10-08 DIAGNOSIS — N760 Acute vaginitis: Secondary | ICD-10-CM | POA: Insufficient documentation

## 2015-10-08 DIAGNOSIS — B9689 Other specified bacterial agents as the cause of diseases classified elsewhere: Secondary | ICD-10-CM

## 2015-10-08 LAB — COMPREHENSIVE METABOLIC PANEL
ALT: 20 U/L (ref 14–54)
AST: 21 U/L (ref 15–41)
Albumin: 4.5 g/dL (ref 3.5–5.0)
Alkaline Phosphatase: 65 U/L (ref 38–126)
Anion gap: 8 (ref 5–15)
BILIRUBIN TOTAL: 0.9 mg/dL (ref 0.3–1.2)
BUN: 11 mg/dL (ref 6–20)
CALCIUM: 9 mg/dL (ref 8.9–10.3)
CO2: 25 mmol/L (ref 22–32)
Chloride: 104 mmol/L (ref 101–111)
Creatinine, Ser: 0.91 mg/dL (ref 0.44–1.00)
GFR calc non Af Amer: >60 mL/min (ref >60)
Glucose, Bld: 102 mg/dL — ABNORMAL HIGH (ref 65–99)
Potassium: 3.1 mmol/L — ABNORMAL LOW (ref 3.5–5.1)
SODIUM: 137 mmol/L (ref 135–145)
TOTAL PROTEIN: 8.1 g/dL (ref 6.5–8.1)

## 2015-10-08 LAB — CBC WITH DIFFERENTIAL/PLATELET
Basophils Absolute: 0 10*3/uL (ref 0.0–0.1)
Basophils Relative: 0 %
EOS ABS: 0.1 10*3/uL (ref 0.0–0.7)
EOS PCT: 2 %
HCT: 42.7 % (ref 36.0–46.0)
HEMOGLOBIN: 14.4 g/dL (ref 12.0–15.0)
Lymphocytes Relative: 32 %
Lymphs Abs: 1.8 10*3/uL (ref 0.7–4.0)
MCH: 27.4 pg (ref 26.0–34.0)
MCHC: 33.7 g/dL (ref 30.0–36.0)
MCV: 81.2 fL (ref 78.0–100.0)
Monocytes Absolute: 0.5 10*3/uL (ref 0.1–1.0)
Monocytes Relative: 9 %
NEUTROS PCT: 57 %
Neutro Abs: 3.2 10*3/uL (ref 1.7–7.7)
Platelets: 223 10*3/uL (ref 150–400)
RBC: 5.26 MIL/uL — ABNORMAL HIGH (ref 3.87–5.11)
RDW: 13.5 % (ref 11.5–15.5)
WBC: 5.5 10*3/uL (ref 4.0–10.5)

## 2015-10-08 LAB — URINALYSIS, ROUTINE W REFLEX MICROSCOPIC
Bilirubin Urine: NEGATIVE
GLUCOSE, UA: NEGATIVE mg/dL
HGB URINE DIPSTICK: NEGATIVE
KETONES UR: 15 mg/dL — AB
Nitrite: NEGATIVE
PH: 6 (ref 5.0–8.0)
Protein, ur: 30 mg/dL — AB
Specific Gravity, Urine: 1.027 (ref 1.005–1.030)

## 2015-10-08 LAB — URINE MICROSCOPIC-ADD ON

## 2015-10-08 LAB — WET PREP, GENITAL
SPERM: NONE SEEN
TRICH WET PREP: NONE SEEN
Yeast Wet Prep HPF POC: NONE SEEN

## 2015-10-08 LAB — PREGNANCY, URINE: Preg Test, Ur: NEGATIVE

## 2015-10-08 MED ORDER — METRONIDAZOLE 500 MG PO TABS: 500.0000 mg | ORAL_TABLET | Freq: Two times a day (BID) | ORAL | Status: DC

## 2015-10-08 MED ORDER — NAPROXEN 500 MG PO TABS: 500.0000 mg | ORAL_TABLET | Freq: Two times a day (BID) | ORAL | Status: DC

## 2015-10-08 MED ORDER — PHENAZOPYRIDINE HCL 200 MG PO TABS: 200.0000 mg | ORAL_TABLET | Freq: Three times a day (TID) | ORAL | Status: DC

## 2015-10-08 MED ORDER — POTASSIUM CHLORIDE CRYS ER 20 MEQ PO TBCR
40.0000 meq | EXTENDED_RELEASE_TABLET | Freq: Once | ORAL | Status: AC
  Administered 2015-10-08: 40 meq via ORAL
  Filled 2015-10-08: qty 2

## 2015-10-08 MED ORDER — SULFAMETHOXAZOLE-TRIMETHOPRIM 800-160 MG PO TABS: 1.0000 | ORAL_TABLET | Freq: Two times a day (BID) | ORAL | Status: AC

## 2015-10-08 NOTE — ED Provider Notes (Signed)
CSN: [id number]     Arrival date & time 10/08/15  0846 History   First MD Initiated Contact with Patient 10/08/15 [phone]     Chief Complaint  Patient presents with  . Abdominal Pain     (Consider location/radiation/quality/duration/timing/severity/associated sxs/prior Treatment) HPI   Anna Koch is a 25 y.o. female, with a history of Ovarian cyst, presenting to the ED with lower abdominal pain since around 6/20. Pain is moderate, intermittent, and cramping. Located more on the right than the left. Also endorses some urinary discomfort. Had annual exam with her GYN on 6/24, diagnosed with trichomonias, and given Flagyl and "two other pills." Last sexual intercourse was around 6/12. Pt has a repeat appointment on 7/24. OBGYN is Darren Delford Field with [facility]. Pt denies fever/chills, N/V/D, vaginal discharge, or any other complaints. Pt states she does not have menstrual cycles any longer because she has the Mirena IUD.     Past Medical History  Diagnosis Date  . Ovarian cyst    History reviewed. No pertinent past surgical history. History reviewed. No pertinent family history. Social History  Substance Use Topics  . Smoking status: Current Some Day Smoker    Types: Cigarettes  . Smokeless tobacco: None  . Alcohol Use: Yes   OB History    No data available     Review of Systems  Constitutional: Negative for fever, chills and diaphoresis.  Respiratory: Negative for shortness of breath.   Cardiovascular: Negative for chest pain.  Gastrointestinal: Positive for abdominal pain. Negative for nausea, vomiting, diarrhea and blood in stool.  Genitourinary: Positive for flank pain. Negative for vaginal bleeding, vaginal discharge and difficulty urinating.  Musculoskeletal: Negative for back pain and neck pain.  Skin: Negative for color change and pallor.  Neurological: Negative for dizziness, syncope, weakness, light-headedness and headaches.  All other systems reviewed and are  negative.     Allergies  Penicillins  Home Medications   Prior to Admission medications   Medication Sig Start Date End Date Taking? Authorizing Provider  metroNIDAZOLE (FLAGYL) 500 MG tablet Take 1 tablet (500 mg total) by mouth 2 (two) times daily. 10/08/15   Essie Lagunes C Prabhjot Maddux, PA-C  naproxen (NAPROSYN) 500 MG tablet Take 1 tablet (500 mg total) by mouth 2 (two) times daily. 10/08/15   Mahdi Frye C Emerlyn Mehlhoff, PA-C  phenazopyridine (PYRIDIUM) 200 MG tablet Take 1 tablet (200 mg total) by mouth 3 (three) times daily. 10/08/15   Knox Holdman C Obdulio Mash, PA-C  sulfamethoxazole-trimethoprim (BACTRIM DS,SEPTRA DS) 800-160 MG tablet Take 1 tablet by mouth 2 (two) times daily. 10/08/15 10/15/15  Arcola Freshour C Wilfrido Luedke, PA-C   BP 131/83 mmHg  Pulse 96  Temp(Src) 98.1 F (36.7 C) (Oral)  Resp 14  Ht 5\' 7"  (1.702 m)  Wt 95.709 kg  BMI 33.04 kg/m2  SpO2 100% Physical Exam  Constitutional: She appears well-developed and well-nourished. No distress.  HENT:  Head: Normocephalic and atraumatic.  Eyes: Conjunctivae are normal.  Neck: Neck supple.  Cardiovascular: Normal rate, regular rhythm, normal heart sounds and intact distal pulses.   Pulmonary/Chest: Effort normal and breath sounds normal. No respiratory distress.  Abdominal: Soft. There is no tenderness. There is no guarding.  No discernible abdominal tenderness.  Genitourinary:  External genitalia normal Vagina with discharge - scant, white discharge noted in the vaginal vault. A clump of what appeared to be pubic hair was noted at the base of the cervix. This was removed during the exam. There was no tissue or blood noted. Cervix  normal negative  for cervical motion tenderness Adnexa palpated, no masses  positive for tenderness in bilateral adnexa Bladder palpated negative for tenderness Uterus palpated no masses or negative for tenderness Otherwise normal female genitalia. RN, Lupe Carneyebekah, served as chaperone during exam.  Musculoskeletal: She exhibits no edema or  tenderness.  Lymphadenopathy:    She has no cervical adenopathy.  Neurological: She is alert.  Skin: Skin is warm and dry. She is not diaphoretic.  Psychiatric: She has a normal mood and affect. Her behavior is normal.  Nursing note and vitals reviewed.   ED Course  Pelvic exam Date/Time: 10/08/2015 9:29 AM Performed by: Anselm PancoastJOY, Ayda Tancredi C Authorized by: Harolyn RutherfordJOY, Marylin Lathon C Consent: Verbal consent obtained. Risks and benefits: risks, benefits and alternatives were discussed Consent given by: patient and parent Patient understanding: patient states understanding of the procedure being performed Patient consent: the patient's understanding of the procedure matches consent given Procedure consent: procedure consent matches procedure scheduled Patient identity confirmed: verbally with patient and arm band Local anesthesia used: no Patient sedated: no Patient tolerance: Patient tolerated the procedure well with no immediate complications   (including critical care time) Labs Review Labs Reviewed  WET PREP, GENITAL - Abnormal; Notable for the following:    Clue Cells Wet Prep HPF POC PRESENT (*)    WBC, Wet Prep HPF POC MANY (*)    All other components within normal limits  URINALYSIS, ROUTINE W REFLEX MICROSCOPIC (NOT [facility]) - Abnormal; Notable for the following:    Color, Urine AMBER (*)    APPearance CLOUDY (*)    Ketones, ur 15 (*)    Protein, ur 30 (*)    Leukocytes, UA MODERATE (*)    All other components within normal limits  CBC WITH DIFFERENTIAL/PLATELET - Abnormal; Notable for the following:    RBC 5.26 (*)    All other components within normal limits  COMPREHENSIVE METABOLIC PANEL - Abnormal; Notable for the following:    Potassium 3.1 (*)    Glucose, Bld 102 (*)    All other components within normal limits  URINE MICROSCOPIC-ADD ON - Abnormal; Notable for the following:    Squamous Epithelial / LPF 6-30 (*)    Bacteria, UA MANY (*)    All other components within normal  limits  URINE CULTURE  PREGNANCY, URINE  RPR  HIV ANTIBODY (ROUTINE TESTING)  GC/CHLAMYDIA PROBE AMP ([place]) NOT AT [facility]    Imaging Review [place] Transvaginal Non-ob  10/08/2015  CLINICAL DATA:  Pelvic pain EXAM: TRANSABDOMINAL AND TRANSVAGINAL ULTRASOUND OF PELVIS TECHNIQUE: Both transabdominal and transvaginal ultrasound examinations of the pelvis were performed. Transabdominal technique was performed for global imaging of the pelvis including uterus, ovaries, adnexal regions, and pelvic cul-de-sac. It was necessary to proceed with endovaginal exam following the transabdominal exam to visualize the the ovaries. COMPARISON:  10/17/2013 FINDINGS: Uterus Measurements: 9.4 x 4.3 x 5.5 cm. The uterus is again noted to be retroverted Endometrium Thickness: 5 mm.  An IUD is noted in satisfactory position. Right ovary Measurements: 4.0 x 3.0 x 2.3 cm. Multiple follicles are identified. The largest of these measures 1.8 cm. Left ovary Measurements: 3.3 x 2.4 x 2.2 cm. Multiple small follicles are noted. Other findings Minimal fluid is noted in the endocervical canal. IMPRESSION: IUD in place. No other definitive abnormality is seen. Electronically Signed   By: Alcide CleverMark  Lukens M.D.   On: 10/08/2015 10:24   [place] Pelvis Complete  10/08/2015  CLINICAL DATA:  Pelvic pain EXAM: TRANSABDOMINAL AND TRANSVAGINAL ULTRASOUND OF  PELVIS TECHNIQUE: Both transabdominal and transvaginal ultrasound examinations of the pelvis were performed. Transabdominal technique was performed for global imaging of the pelvis including uterus, ovaries, adnexal regions, and pelvic cul-de-sac. It was necessary to proceed with endovaginal exam following the transabdominal exam to visualize the the ovaries. COMPARISON:  10/17/2013 FINDINGS: Uterus Measurements: 9.4 x 4.3 x 5.5 cm. The uterus is again noted to be retroverted Endometrium Thickness: 5 mm.  An IUD is noted in satisfactory position. Right ovary Measurements: 4.0 x 3.0 x 2.3 cm. Multiple  follicles are identified. The largest of these measures 1.8 cm. Left ovary Measurements: 3.3 x 2.4 x 2.2 cm. Multiple small follicles are noted. Other findings Minimal fluid is noted in the endocervical canal. IMPRESSION: IUD in place. No other definitive abnormality is seen. Electronically Signed   By: Alcide Clever M.D.   On: 10/08/2015 10:24   I have personally reviewed and evaluated these images and lab results as part of my medical decision-making.   EKG Interpretation None      MDM   Final diagnoses:  Lower abdominal pain  Acute cystitis without hematuria  BV (bacterial vaginosis)    Andrey Cota presents with intermittent lower abdominal discomfort since June 20.  Possible BV on workup. UTI on UA. IUD is in place with no abnormalities noted on ultrasound. Patient had is nontoxic appearing, has no signs of sepsis, and is in no apparent distress. Will treat UTI and encourage patient to keep her OB/GYN appointment on July 24. The patient was given instructions for home care as well as return precautions. Patient voices understanding of these instructions, accepts the plan, and is comfortable with discharge.  Filed Vitals:   10/08/15 0901 10/08/15 1046  BP: 131/83 129/90  Pulse: 96 78  Temp: 98.1 F (36.7 C)   TempSrc: Oral   Resp: 14 16  Height:  (1.702 m)   Weight: 95.709 kg   SpO2: 100% 100%     Anselm Pancoast, PA-C 10/08/15 1345  Courteney Lyn Mackuen, MD 10/08/15 1410

## 2015-10-08 NOTE — Discharge Instructions (Signed)
You have been seen today for abdominal pain. There were no abnormalities noted on the ultrasound and your IUD is in the proper place. There was evidence of bacterial vaginosis (BV) on wet prep and an UTI on the urinalysis. The Bactrim is for the UTI and the Flagyl (metronidazole) is for the BV. Please take all of your antibiotics until finished!   You may develop abdominal discomfort or diarrhea from the antibiotic.  You may help offset this with probiotics which you can buy or get in yogurt. Do not eat or take the probiotics until 2 hours after your antibiotic. Keep your appointment with your OBGYN on July 24, as scheduled. Follow up with PCP as needed.

## 2015-10-08 NOTE — ED Notes (Signed)
Patient reports lower abdominal pain since 6/20.  Reports that she was seen 6/24 at gyn and had pap smear-diagnosed with trichomonias.  Reports that she finished antibiotic course but continues to have lower abdominal pain.  States she has follow up 7/24 at gyn.  Denies n/v/d, discharge, fevers.

## 2015-10-09 ENCOUNTER — Telehealth: Payer: Self-pay | Admitting: *Deleted

## 2015-10-09 LAB — URINE CULTURE

## 2015-10-09 LAB — GC/CHLAMYDIA PROBE AMP (~~LOC~~) NOT AT ARMC
CHLAMYDIA, DNA PROBE: NEGATIVE
Neisseria Gonorrhea: NEGATIVE

## 2015-10-09 LAB — RPR: RPR Ser Ql: NONREACTIVE

## 2015-10-09 LAB — HIV ANTIBODY (ROUTINE TESTING W REFLEX): HIV Screen 4th Generation wRfx: NONREACTIVE

## 2016-05-06 ENCOUNTER — Emergency Department (HOSPITAL_BASED_OUTPATIENT_CLINIC_OR_DEPARTMENT_OTHER)
Admission: EM | Admit: 2016-05-06 | Discharge: 2016-05-06 | Disposition: A | Discharge status: Home / Self Care | Payer: Medicaid Other | Attending: Emergency Medicine | Admitting: Emergency Medicine

## 2016-05-06 ENCOUNTER — Encounter (HOSPITAL_BASED_OUTPATIENT_CLINIC_OR_DEPARTMENT_OTHER): Payer: Self-pay | Admitting: *Deleted

## 2016-05-06 DIAGNOSIS — F17210 Nicotine dependence, cigarettes, uncomplicated: Secondary | ICD-10-CM | POA: Insufficient documentation

## 2016-05-06 DIAGNOSIS — N760 Acute vaginitis: Secondary | ICD-10-CM

## 2016-05-06 DIAGNOSIS — N3001 Acute cystitis with hematuria: Secondary | ICD-10-CM | POA: Insufficient documentation

## 2016-05-06 DIAGNOSIS — B3731 Acute candidiasis of vulva and vagina: Secondary | ICD-10-CM

## 2016-05-06 DIAGNOSIS — B9689 Other specified bacterial agents as the cause of diseases classified elsewhere: Secondary | ICD-10-CM

## 2016-05-06 DIAGNOSIS — R109 Unspecified abdominal pain: Secondary | ICD-10-CM | POA: Diagnosis present

## 2016-05-06 DIAGNOSIS — B373 Candidiasis of vulva and vagina: Secondary | ICD-10-CM | POA: Insufficient documentation

## 2016-05-06 LAB — URINALYSIS, ROUTINE W REFLEX MICROSCOPIC
Bilirubin Urine: NEGATIVE
Glucose, UA: NEGATIVE mg/dL
Ketones, ur: NEGATIVE mg/dL
Nitrite: NEGATIVE
Protein, ur: NEGATIVE mg/dL
SPECIFIC GRAVITY, URINE: 1.017 (ref 1.005–1.030)
pH: 7 (ref 5.0–8.0)

## 2016-05-06 LAB — WET PREP, GENITAL
Sperm: NONE SEEN
TRICH WET PREP: NONE SEEN

## 2016-05-06 LAB — URINALYSIS, MICROSCOPIC (REFLEX)

## 2016-05-06 LAB — PREGNANCY, URINE: Preg Test, Ur: NEGATIVE

## 2016-05-06 MED ORDER — CEPHALEXIN 500 MG PO CAPS: 500.0000 mg | ORAL_CAPSULE | Freq: Four times a day (QID) | ORAL | 0 refills | Status: AC

## 2016-05-06 MED ORDER — NAPROXEN 500 MG PO TABS: 500.0000 mg | ORAL_TABLET | Freq: Two times a day (BID) | ORAL | 0 refills | Status: DC

## 2016-05-06 MED ORDER — CYCLOBENZAPRINE HCL 5 MG PO TABS: 5.0000 mg | ORAL_TABLET | Freq: Three times a day (TID) | ORAL | 0 refills | Status: DC | PRN

## 2016-05-06 MED ORDER — FLUCONAZOLE 50 MG PO TABS
150.0000 mg | ORAL_TABLET | Freq: Once | ORAL | Status: AC
  Administered 2016-05-06: 13:00:00 150 mg via ORAL
  Filled 2016-05-06: qty 1

## 2016-05-06 MED ORDER — CEFTRIAXONE SODIUM 250 MG IJ SOLR
250.0000 mg | Freq: Once | INTRAMUSCULAR | Status: AC
  Administered 2016-05-06: 250 mg via INTRAMUSCULAR
  Filled 2016-05-06: qty 250

## 2016-05-06 MED ORDER — FLUCONAZOLE 150 MG PO TABS: ORAL_TABLET | ORAL | 0 refills | Status: DC

## 2016-05-06 MED ORDER — METRONIDAZOLE 500 MG PO TABS: 500.0000 mg | ORAL_TABLET | Freq: Two times a day (BID) | ORAL | 0 refills | Status: DC

## 2016-05-06 MED ORDER — CEPHALEXIN 500 MG PO CAPS: 500.0000 mg | ORAL_CAPSULE | Freq: Four times a day (QID) | ORAL | 0 refills | Status: DC

## 2016-05-06 MED ORDER — AZITHROMYCIN 1 G PO PACK
1.0000 g | PACK | Freq: Once | ORAL | Status: AC
  Administered 2016-05-06: 1 g via ORAL
  Filled 2016-05-06: qty 1

## 2016-05-06 NOTE — ED Provider Notes (Signed)
[facility]   CSN: [id number] Arrival date & time: 05/06/16  1108  History   Chief Complaint Chief Complaint  Patient presents with  . Abdominal Pain    HPI Anna Koch is a 26 y.o. female who presents with Abdominal pain  HPI Patient reports diffuse abdominal pain for 3 weeks. Describes the pain as cramps and sometimes sharp. Pain is intermittent. No association with meals. Reports bilateral flank pain. Reports one episode of emesis about a week ago. She reports intermittent nausea since then. She also reports dysuria. She denies fever or chills. She reports having similar pain in the past when she had a UTI. She went to urgent care about a week ago and was diagnosed with UTI. She was treated with Cipro for 7 days and finished the course without significant improvement in her symptoms. She saw her gynecologist about 5 days ago. She says repeat urine culture was negative but showed hematuria. She went to her PCP with similar symptoms today and PCP recommended going to ED for evaluation.  Reports history of ovarian cysts. Also history of STD (trichomoniasis and chlamydia) about a year ago. Last sexual intercourse about 3 months ago. She is on IUD Mirena for birth control placed on August 2015. Reports some spotting last week. Reports smoking 3 cigarettes a day and drinking alcohol occasionally. Denies drug use.   Past Medical History:  Diagnosis Date  . Ovarian cyst     There are no active problems to display for this patient.   Past Surgical History:  Procedure Laterality Date  . CARPAL TUNNEL RELEASE      OB History    No data available       Home Medications    Prior to Admission medications   Medication Sig Start Date End Date Taking? Authorizing Provider  cephALEXin (KEFLEX) 500 MG capsule Take 1 capsule (500 mg total) by mouth 4 (four) times daily. Take for 7 days 05/06/16 05/14/16  Almon Hercules, MD  cyclobenzaprine (FLEXERIL) 5 MG tablet  Take 1 tablet (5 mg total) by mouth 3 (three) times daily as needed for muscle spasms. 05/06/16   Almon Hercules, MD  fluconazole (DIFLUCAN) 150 MG tablet Take 1 tablet in 3 days if no improvement in your symptoms 05/06/16   Almon Hercules, MD  metroNIDAZOLE (FLAGYL) 500 MG tablet Take 1 tablet (500 mg total) by mouth 2 (two) times daily. 05/06/16   Almon Hercules, MD  naproxen (NAPROSYN) 500 MG tablet Take 1 tablet (500 mg total) by mouth 2 (two) times daily. 05/06/16   Almon Hercules, MD  phenazopyridine (PYRIDIUM) 200 MG tablet Take 1 tablet (200 mg total) by mouth 3 (three) times daily. 10/08/15   Anselm Pancoast, PA-C    Family History No family history on file.  Social History Social History  Substance Use Topics  . Smoking status: Current Some Day Smoker    Types: Cigarettes  . Smokeless tobacco: Never Used  . Alcohol use Yes     Allergies   Penicillins   Review of Systems Review of Systems  Constitutional: Negative for chills and fever.  HENT: Negative for sore throat.   Eyes: Negative for visual disturbance.  Respiratory: Negative for cough.   Cardiovascular: Negative for chest pain.  Gastrointestinal: Positive for abdominal pain and nausea. Negative for blood in stool, constipation, diarrhea and vomiting.  Genitourinary: Positive for dysuria. Negative for hematuria, menstrual problem, urgency, vaginal bleeding and vaginal discharge.  Musculoskeletal:  Negative for myalgias.  Skin: Negative for rash.  Neurological: Negative for dizziness and headaches.  Psychiatric/Behavioral: Negative for confusion.   Physical Exam Updated Vital Signs BP 137/99 (BP Location: Right Arm)   Pulse 69   Temp 98.3 F (36.8 C) (Oral)   Resp 18   Ht 5\' 7"  (1.702 m)   Wt 96.6 kg   SpO2 100%   BMI 33.36 kg/m   Physical Exam GEN: Lying in bed, appears well, no apparent distress. Head: normocephalic and atraumatic  Eyes: conjunctiva without injection, sclera anicteric Oropharynx: mmm without erythema  or exudation HEM: negative for cervical or periauricular lymphadenopathies CVS: RRR, nl s1 & s2, no murmurs, no edema,  cap refills < 2 secs RESP: speaks in full sentence, no IWOB, CTAB GI: BS present & normal, soft, mild suprapubic tenderness with deep palpation, no guarding, no rebound, no mass GU:  No suprapubic or CVA tenerness External genitalia: normal without surrounding skin lesion, obvious discharge or bleeeding.  Speculum: pink vaginal mucosa, ruggated, normal cervix & discharge. Bimanual: no cervical motion tenderness or adnexal mass. Uterus appears normal size MSK: no focal tenderness or notable swelling SKIN: no apparent skin lesion NEURO: alert and oiented appropriately, no gross defecits  PSYCH: euthymic mood with congruent affect  ED Treatments / Results  Labs (all labs ordered are listed, but only abnormal results are displayed) Labs Reviewed  WET PREP, GENITAL - Abnormal; Notable for the following:       Result Value   Yeast Wet Prep HPF POC PRESENT (*)    Clue Cells Wet Prep HPF POC PRESENT (*)    WBC, Wet Prep HPF POC MODERATE (*)    All other components within normal limits  URINALYSIS, ROUTINE W REFLEX MICROSCOPIC - Abnormal; Notable for the following:    Hgb urine dipstick SMALL (*)    Leukocytes, UA MODERATE (*)    All other components within normal limits  URINALYSIS, MICROSCOPIC (REFLEX) - Abnormal; Notable for the following:    Bacteria, UA FEW (*)    Squamous Epithelial / LPF 0-5 (*)    All other components within normal limits  URINE CULTURE  PREGNANCY, URINE  GC/CHLAMYDIA PROBE AMP ([place]) NOT AT [facility]    EKG  EKG Interpretation None       Radiology No results found.  Procedures Procedures (including critical care time)  Medications Ordered in ED Medications  fluconazole (DIFLUCAN) tablet 150 mg (150 mg Oral Given 05/06/16 1318)  cefTRIAXone (ROCEPHIN) injection 250 mg (250 mg Intramuscular Given 05/06/16 1320)  azithromycin  (ZITHROMAX) powder 1 g (1 g Oral Given 05/06/16 1318)     Initial Impression / Assessment and Plan / ED Course  I have reviewed the triage vital signs and the nursing notes.  Pertinent labs & imaging results that were available during my care of the patient were reviewed by me and considered in my medical decision making (see chart for details).  Patient with abdominal pain and dysuria suggestive for UTI. UA with few bacteria, small hemoglobin and moderate leukocytes.  She has no fever, chills or CVA tenderness to think of pyelonephritis. She has small hemoglobin on UA but pain not intense to suggest nephrolithiasis. Low suspicion for acute abdomen based on history and exam. Speculum and bimanual exam with normal limits. Wet prep with clue cells and yeast. Obtained cervical vaginal swab for GC/CT. Discussed about the option of empiric treatment for GC/CT. Patient agreed to this.  -Gave ceftriaxone injection and azithromycin 1 g stat  in ED.  -Gave Diflucan 150 mg tablet in ED. Gave prescription for another dose in case no improvement in her symptoms in 3-4 days. -Gave prescription for metronidazole 500 mg twice a day for 7 days for bacterial vaginosis -Gave prescription for Keflex 500 mg 4 times a day for 5 days for UTI  Final Clinical Impressions(s) / ED Diagnoses   Final diagnoses:  Vaginal candidiasis  Bacterial vaginosis  Acute cystitis with hematuria    New Prescriptions Discharge Medication List as of 05/06/2016  1:30 PM       Almon Herculesaye T Aibhlinn Kalmar, MD 05/06/16 [id number]    Alvira MondayErin Schlossman, MD 05/09/16 1313

## 2016-05-06 NOTE — ED Triage Notes (Signed)
She was treated for a UTI a week ago. She is no better after taking antibiotics. She was seen by her OBGYN on Monday and had blood in her urine and by her MD today and was told she has blood in her urine. She was told to come here to see if she has a kidney stone.

## 2016-05-06 NOTE — Discharge Instructions (Signed)
Your were seen for abdominal pain. Your urine test is concerning for urinary tract infection. We have sent prescription for antibiotic to your pharmacy. Please take this medication until you complete the whole course. We also tested you for bacterial vaginosis and yeast infection which were positive. We gave you a prescription for metronidazole for bacterial vaginosis. We gave you Diflucan for yeast infection. Gave a prescription for another tablet of fluconazole if your symptoms won't improve in three to four days.

## 2016-05-07 LAB — URINE CULTURE: Culture: NO GROWTH

## 2016-05-08 NOTE — ED Notes (Signed)
Called to inquire of test results that were done on Friday. Informed that test still pending

## 2016-05-09 LAB — GC/CHLAMYDIA PROBE AMP (~~LOC~~) NOT AT ARMC
Chlamydia: NEGATIVE
Neisseria Gonorrhea: NEGATIVE

## 2016-07-27 ENCOUNTER — Encounter (HOSPITAL_BASED_OUTPATIENT_CLINIC_OR_DEPARTMENT_OTHER): Payer: Self-pay

## 2016-07-27 ENCOUNTER — Emergency Department (HOSPITAL_BASED_OUTPATIENT_CLINIC_OR_DEPARTMENT_OTHER)
Admission: EM | Admit: 2016-07-27 | Discharge: 2016-07-27 | Disposition: A | Discharge status: Home / Self Care | Payer: Medicaid Other | Attending: Emergency Medicine | Admitting: Emergency Medicine

## 2016-07-27 ENCOUNTER — Emergency Department (HOSPITAL_BASED_OUTPATIENT_CLINIC_OR_DEPARTMENT_OTHER): Payer: Medicaid Other

## 2016-07-27 DIAGNOSIS — M436 Torticollis: Secondary | ICD-10-CM | POA: Insufficient documentation

## 2016-07-27 DIAGNOSIS — F17210 Nicotine dependence, cigarettes, uncomplicated: Secondary | ICD-10-CM | POA: Insufficient documentation

## 2016-07-27 DIAGNOSIS — M542 Cervicalgia: Secondary | ICD-10-CM | POA: Diagnosis present

## 2016-07-27 MED ORDER — KETOROLAC TROMETHAMINE 60 MG/2ML IM SOLN
60.0000 mg | Freq: Once | INTRAMUSCULAR | Status: AC
  Administered 2016-07-27: 60 mg via INTRAMUSCULAR
  Filled 2016-07-27: qty 2

## 2016-07-27 MED ORDER — BACLOFEN 10 MG PO TABS: 10.0000 mg | ORAL_TABLET | Freq: Three times a day (TID) | ORAL | 0 refills | Status: DC

## 2016-07-27 MED ORDER — MELOXICAM 15 MG PO TABS: 15.0000 mg | ORAL_TABLET | Freq: Every day | ORAL | 0 refills | Status: DC

## 2016-07-27 MED FILL — MELOXICAM 15 MG TABLET: 15 | 10 days supply | Qty: 10 | Fill #0

## 2016-07-27 MED FILL — BACLOFEN 10 MG TABLET: 10 | 10 days supply | Qty: 30 | Fill #0

## 2016-07-27 NOTE — Discharge Instructions (Signed)
SEEK IMMEDIATE MEDICAL CARE IF: You develop difficulty breathing. You develop noisy breathing (stridor). You start drooling. You have trouble swallowing or have pain with swallowing. You develop numbness or weakness in your hands or feet. You have changes in your speech, understanding, or vision. Your pain gets worse.

## 2016-07-27 NOTE — ED Triage Notes (Signed)
c/o bilat, posterior neck pain-started yesterday-denies injury, fever-NAD-steady gait

## 2016-07-27 NOTE — ED Notes (Signed)
Patient transported to CT 

## 2016-07-27 NOTE — ED Provider Notes (Signed)
[facility] Provider [facility]   CSN: [id number] Arrival date & time: 07/27/16  1231     History   Chief Complaint Chief Complaint  Patient presents with  . Neck Pain    HPI Anna Koch is a 26 y.o. female who presents emergency department with chief complaint of posterior neck pain. She's had previous episodes of self-limited posterior neck and shoulder pain. Patient states that yesterday it was bothering her somewhat, but today she woke up and had significant pain with flexion and extension, rotation of the neck. The patient states that she frequently has some pain in her neck. She denies any numbness or tingling. Her mother and she are concerned that she may have this problem and would like imaging of the neck. Eyes, any history of injury.  The patient does type for a living.  HPI  Past Medical History:  Diagnosis Date  . Ovarian cyst     There are no active problems to display for this patient.   Past Surgical History:  Procedure Laterality Date  . CARPAL TUNNEL RELEASE      OB History    No data available       Home Medications    Prior to Admission medications   Not on File    Family History No family history on file.  Social History Social History  Substance Use Topics  . Smoking status: Current Some Day Smoker    Types: Cigarettes  . Smokeless tobacco: Never Used  . Alcohol use Yes     Comment: occ     Allergies   Penicillins   Review of Systems Review of Systems Ten systems reviewed and are negative for acute change, except as noted in the HPI. \   Physical Exam Updated Vital Signs BP 134/86 (BP Location: Left Arm)   Pulse 72   Temp 98.7 F (37.1 C) (Oral)   Resp 17   Ht  (1.702 m)   Wt 101.6 kg   SpO2 100%   BMI 35.08 kg/m   Physical Exam  Constitutional: She is oriented to person, place, and time. She appears well-developed and well-nourished. No distress.  HENT:  Head: Normocephalic and atraumatic.    Eyes: Conjunctivae are normal. No scleral icterus.  Neck: Normal range of motion.  Cardiovascular: Normal rate, regular rhythm and normal heart sounds.  Exam reveals no gallop and no friction rub.   No murmur heard. Pulmonary/Chest: Effort normal and breath sounds normal. No respiratory distress.  Abdominal: Soft. Bowel sounds are normal. She exhibits no distension and no mass. There is no tenderness. There is no guarding.  Musculoskeletal:  No midline spinal tenderness. Tender to palpation in the bilateral cervical paraspinal muscles and bilateral upper trapezius fibers. Patient denies pain is worse with flexion and right and left rotation of the neck.  Neurological: She is alert and oriented to person, place, and time.  Skin: Skin is warm and dry. She is not diaphoretic.  Psychiatric: Her behavior is normal.  Nursing note and vitals reviewed.    ED Treatments / Results  Labs (all labs ordered are listed, but only abnormal results are displayed) Labs Reviewed - No data to display  EKG  EKG Interpretation None       Radiology Ct Cervical Spine Wo Contrast  Result Date: 07/27/2016 CLINICAL DATA:  Neck pain with bilateral shoulder pain. No known injury EXAM: CT CERVICAL SPINE WITHOUT CONTRAST TECHNIQUE: Multidetector CT imaging of the cervical spine was performed without intravenous contrast.  Multiplanar CT image reconstructions were also generated. COMPARISON:  None. FINDINGS: Alignment: Normal Skull base and vertebrae: Negative for fracture or mass. Soft tissues and spinal canal: Negative for soft tissue mass or edema. Disc levels: Mild disc degeneration and spurring at C5-6 without significant spinal or foraminal stenosis. Remaining disc spaces intact. Upper chest: Negative Other: None IMPRESSION: Mild disc degeneration and mild spurring at C5-6. Otherwise negative Electronically Signed   By: Marlan Palau M.D.   On: 07/27/2016 14:49    Procedures Procedures (including critical  care time)  Medications Ordered in ED Medications - No data to display   Initial Impression / Assessment and Plan / ED Course  I have reviewed the triage vital signs and the nursing notes.  Pertinent labs & imaging results that were available during my care of the patient were reviewed by me and considered in my medical decision making (see chart for details).     . Patient imaging is negative for any acute abnormality. His pulse to be acute torticollis. The patient be discharged with Motrin and baclofen. She is advised follow-up with her primary care physician. I have given the patient neck exercises. Discussed home. Supportive therapy. She appears safe for discharge at this time with no abnormal neurologic symptoms, and normal upper extremity strength.  Final Clinical Impressions(s) / ED Diagnoses   Final diagnoses:  Torticollis, acute    New Prescriptions New Prescriptions   No medications on file     Arthor Captain, PA-C 07/27/16 1611    Canary Brim Tegeler, MD 07/27/16 1958

## 2017-06-19 ENCOUNTER — Other Ambulatory Visit: Payer: Self-pay

## 2017-06-19 ENCOUNTER — Emergency Department (HOSPITAL_BASED_OUTPATIENT_CLINIC_OR_DEPARTMENT_OTHER): Payer: 59

## 2017-06-19 ENCOUNTER — Encounter (HOSPITAL_BASED_OUTPATIENT_CLINIC_OR_DEPARTMENT_OTHER): Payer: Self-pay | Admitting: Emergency Medicine

## 2017-06-19 ENCOUNTER — Emergency Department (HOSPITAL_BASED_OUTPATIENT_CLINIC_OR_DEPARTMENT_OTHER)
Admission: EM | Admit: 2017-06-19 | Discharge: 2017-06-19 | Disposition: A | Discharge status: Home / Self Care | Payer: 59 | Attending: Emergency Medicine | Admitting: Emergency Medicine

## 2017-06-19 DIAGNOSIS — J069 Acute upper respiratory infection, unspecified: Secondary | ICD-10-CM | POA: Insufficient documentation

## 2017-06-19 DIAGNOSIS — J209 Acute bronchitis, unspecified: Secondary | ICD-10-CM | POA: Diagnosis not present

## 2017-06-19 DIAGNOSIS — R05 Cough: Secondary | ICD-10-CM | POA: Diagnosis present

## 2017-06-19 DIAGNOSIS — B9789 Other viral agents as the cause of diseases classified elsewhere: Secondary | ICD-10-CM | POA: Insufficient documentation

## 2017-06-19 DIAGNOSIS — F17210 Nicotine dependence, cigarettes, uncomplicated: Secondary | ICD-10-CM | POA: Insufficient documentation

## 2017-06-19 DIAGNOSIS — Z79899 Other long term (current) drug therapy: Secondary | ICD-10-CM | POA: Insufficient documentation

## 2017-06-19 MED ORDER — GUAIFENESIN-CODEINE 100-10 MG/5ML PO SOLN: 5.0000 mL | Freq: Every evening | ORAL | 0 refills | Status: DC | PRN

## 2017-06-19 MED ORDER — GUAIFENESIN-CODEINE 100-10 MG/5ML PO SOLN
5.0000 mL | Freq: Once | ORAL | Status: AC
  Administered 2017-06-19: 5 mL via ORAL
  Filled 2017-06-19: qty 5

## 2017-06-19 MED FILL — GUAIATUSSIN AC LIQUID: 100-10 | 24 days supply | Qty: 120 | Fill #0

## 2017-06-19 NOTE — ED Triage Notes (Signed)
Reports productive cough since Thursday.

## 2017-06-19 NOTE — ED Provider Notes (Signed)
[facility] EMERGENCY DEPARTMENT Provider Note   CSN: [id number] Arrival date & time: 06/19/17  1428     History   Chief Complaint Chief Complaint  Patient presents with  . Cough    HPI Anna Koch is a 27 y.o. female.  Anna Koch is a 27 y.o. Female otherwise healthy, presents to the ED for evaluation of productive cough since Thursday.  Patient reports persistent cough that is been keeping her up at night, occasionally productive of clear sputum.  Patient reports some associated mild nasal congestion and rhinorrhea.  She reports scratchy throat, denies any ear pain.  Denies fevers or chills.  No chest pain or shortness of breath aside from soreness from coughing.  Patient denies any abdominal pain, emesis, nausea or diarrhea.  He has been using cough drops for her symptoms has not tried anything else to treat these.  Reports gradual onset of symptoms, not improving, denies any associated body aches, no known exposures to the flu.  Denies any other aggravating or alleviating factors.     Past Medical History:  Diagnosis Date  . Ovarian cyst     There are no active problems to display for this patient.   Past Surgical History:  Procedure Laterality Date  . CARPAL TUNNEL RELEASE       OB History   None      Home Medications    Prior to Admission medications   Medication Sig Start Date End Date Taking? Authorizing Provider  baclofen (LIORESAL) 10 MG tablet Take 1 tablet (10 mg total) by mouth 3 (three) times daily. 07/27/16   Arthor CaptainHarris, Abigail, PA-C  meloxicam (MOBIC) 15 MG tablet Take 1 tablet (15 mg total) by mouth daily. Take 1 daily with food. 07/27/16   Arthor CaptainHarris, Abigail, PA-C    Family History History reviewed. No pertinent family history.  Social History Social History   Tobacco Use  . Smoking status: Current Some Day Smoker    Types: Cigarettes  . Smokeless tobacco: Never Used  Substance Use Topics  . Alcohol use: Yes    Comment: occ   . Drug use: No     Allergies   Penicillins   Review of Systems Review of Systems  Constitutional: Negative for chills and fever.  HENT: Positive for congestion, postnasal drip and rhinorrhea. Negative for ear discharge, ear pain, sinus pressure, sinus pain, sneezing and sore throat.   Respiratory: Positive for cough. Negative for chest tightness, shortness of breath and wheezing.   Cardiovascular: Negative for chest pain.  Gastrointestinal: Negative for abdominal pain, diarrhea, nausea and vomiting.  Genitourinary: Negative for dysuria.  Musculoskeletal: Negative for arthralgias and myalgias.  Skin: Negative for color change and rash.  Neurological: Negative for dizziness, light-headedness and headaches.     Physical Exam Updated Vital Signs BP (!) 153/98   Pulse 86   Temp 98.1 F (36.7 C) (Oral)   Resp 18   Ht 5\' 7"  (1.702 m)   Wt 99.8 kg (220 lb)   SpO2 98%   BMI 34.46 kg/m   Physical Exam  Constitutional: She appears well-developed and well-nourished. No distress.  Actively coughing during exam, in no acute distress  HENT:  Head: Normocephalic and atraumatic.  TMs clear with good landmarks, moderate nasal mucosa edema with clear rhinorrhea, posterior oropharynx clear and moist, with some erythema, no edema or exudates, uvula midline  Eyes: Right eye exhibits no discharge. Left eye exhibits no discharge.  Neck: Normal range of motion. Neck supple.  Cardiovascular:  Normal rate, regular rhythm, normal heart sounds and intact distal pulses.  Pulmonary/Chest: Effort normal and breath sounds normal. No stridor. No respiratory distress. She has no wheezes. She has no rales.  Respirations equal and unlabored, patient able to speak in full sentences, lungs clear to auscultation bilaterally  Abdominal: Soft. Bowel sounds are normal. She exhibits no distension and no mass. There is no tenderness. There is no guarding.  Neurological: She is alert. Coordination normal.  Skin:  Skin is warm and dry. Capillary refill takes less than 2 seconds. She is not diaphoretic.  Psychiatric: She has a normal mood and affect. Her behavior is normal.  Nursing note and vitals reviewed.    ED Treatments / Results  Labs (all labs ordered are listed, but only abnormal results are displayed) Labs Reviewed - No data to display  EKG None  Radiology Dg Chest 2 View  Result Date: 06/19/2017 CLINICAL DATA:  Allergic reaction EXAM: CHEST - 2 VIEW COMPARISON:  03/13/2012 FINDINGS: The heart size and mediastinal contours are within normal limits. Both lungs are clear. The visualized skeletal structures are unremarkable. IMPRESSION: No active cardiopulmonary disease. Electronically Signed   By: Jasmine Pang M.D.   On: 06/19/2017 15:10    Procedures Procedures (including critical care time)  Medications Ordered in ED Medications  guaiFENesin-codeine 100-10 MG/5ML solution 5 mL (5 mLs Oral Given 06/19/17 1714)     Initial Impression / Assessment and Plan / ED Course  I have reviewed the triage vital signs and the nursing notes.  Pertinent labs & imaging results that were available during my care of the patient were reviewed by me and considered in my medical decision making (see chart for details).  Pt presents with nasal congestion and cough. Pt is well appearing and vitals are normal. Lungs CTA on exam. Pt CXR negative for acute infiltrate. Patients symptoms are consistent with URI, likely viral etiology. Discussed that antibiotics are not indicated for viral infections.  Guaifenesin with codeine provided here in the ED.  Pt will be discharged with symptomatic treatment.  Precautions discussed.  Verbalizes understanding and is agreeable with plan. Pt is hemodynamically stable & in NAD prior to dc.   Final Clinical Impressions(s) / ED Diagnoses   Final diagnoses:  Viral URI with cough  Acute bronchitis, unspecified organism    ED Discharge Orders        Ordered     guaiFENesin-codeine 100-10 MG/5ML syrup  At bedtime PRN     06/19/17 1707       Dartha Lodge, PA-C 06/19/17 1807    Rolland Porter, MD 06/20/17 [phone]

## 2017-06-19 NOTE — Discharge Instructions (Addendum)
Your symptoms are likely caused by a viral upper respiratory infection. Antibiotics are not helpful in treating viral infection, the virus should run its course in about 5-7 days. Please make sure you are drinking plenty of fluids. You can treat your symptoms supportively with tylenol/ibuprofen for fevers and pains, Zyrtec and Flonase to heal with nasal congestion, and 1 dose of mucinex in the morning and prescribed cough syrup before bed and throat lozenges to help with cough. If your symptoms are not improving please follow up with you Primary doctor.   If you develop persistent fevers, shortness of breath or difficulty breathing, chest pain, severe headache and neck pain, persistent nausea and vomiting or other new or concerning symptoms return to the Emergency department.

## 2017-06-19 NOTE — ED Notes (Signed)
ED Provider at bedside. 

## 2017-09-26 ENCOUNTER — Other Ambulatory Visit: Payer: Self-pay

## 2017-09-26 ENCOUNTER — Encounter (HOSPITAL_BASED_OUTPATIENT_CLINIC_OR_DEPARTMENT_OTHER): Payer: Self-pay

## 2017-09-26 ENCOUNTER — Emergency Department (HOSPITAL_BASED_OUTPATIENT_CLINIC_OR_DEPARTMENT_OTHER)
Admission: EM | Admit: 2017-09-26 | Discharge: 2017-09-26 | Disposition: A | Discharge status: Home / Self Care | Payer: 59 | Attending: Emergency Medicine | Admitting: Emergency Medicine

## 2017-09-26 DIAGNOSIS — Z79899 Other long term (current) drug therapy: Secondary | ICD-10-CM | POA: Diagnosis not present

## 2017-09-26 DIAGNOSIS — R1011 Right upper quadrant pain: Secondary | ICD-10-CM | POA: Diagnosis not present

## 2017-09-26 DIAGNOSIS — R109 Unspecified abdominal pain: Secondary | ICD-10-CM

## 2017-09-26 DIAGNOSIS — F17210 Nicotine dependence, cigarettes, uncomplicated: Secondary | ICD-10-CM | POA: Insufficient documentation

## 2017-09-26 DIAGNOSIS — R1031 Right lower quadrant pain: Secondary | ICD-10-CM | POA: Insufficient documentation

## 2017-09-26 LAB — COMPREHENSIVE METABOLIC PANEL
ALK PHOS: 72 U/L (ref 38–126)
ALT: 31 U/L (ref 0–44)
ANION GAP: 9 (ref 5–15)
AST: 27 U/L (ref 15–41)
Albumin: 4.6 g/dL (ref 3.5–5.0)
BILIRUBIN TOTAL: 0.6 mg/dL (ref 0.3–1.2)
BUN: 9 mg/dL (ref 6–20)
CALCIUM: 8.6 mg/dL — AB (ref 8.9–10.3)
CO2: 26 mmol/L (ref 22–32)
Chloride: 100 mmol/L (ref 98–111)
Creatinine, Ser: 0.85 mg/dL (ref 0.44–1.00)
GFR calc Af Amer: >60 mL/min (ref >60)
GFR calc non Af Amer: >60 mL/min (ref >60)
GLUCOSE: 94 mg/dL (ref 70–99)
POTASSIUM: 3.2 mmol/L — AB (ref 3.5–5.1)
Sodium: 135 mmol/L (ref 135–145)
TOTAL PROTEIN: 8.4 g/dL — AB (ref 6.5–8.1)

## 2017-09-26 LAB — LIPASE, BLOOD: Lipase: 35 U/L (ref 11–51)

## 2017-09-26 LAB — URINALYSIS, ROUTINE W REFLEX MICROSCOPIC
BILIRUBIN URINE: NEGATIVE
Glucose, UA: NEGATIVE mg/dL
HGB URINE DIPSTICK: NEGATIVE
Ketones, ur: NEGATIVE mg/dL
Leukocytes, UA: NEGATIVE
Nitrite: NEGATIVE
Protein, ur: NEGATIVE mg/dL
Specific Gravity, Urine: 1.01 (ref 1.005–1.030)
pH: 7 (ref 5.0–8.0)

## 2017-09-26 LAB — CBC
HEMATOCRIT: 42.5 % (ref 36.0–46.0)
HEMOGLOBIN: 14.3 g/dL (ref 12.0–15.0)
MCH: 26.7 pg (ref 26.0–34.0)
MCHC: 33.6 g/dL (ref 30.0–36.0)
MCV: 79.4 fL (ref 78.0–100.0)
Platelets: 244 10*3/uL (ref 150–400)
RBC: 5.35 MIL/uL — ABNORMAL HIGH (ref 3.87–5.11)
RDW: 14.8 % (ref 11.5–15.5)
WBC: 6.5 10*3/uL (ref 4.0–10.5)

## 2017-09-26 LAB — PREGNANCY, URINE: PREG TEST UR: NEGATIVE

## 2017-09-26 MED ORDER — GI COCKTAIL ~~LOC~~
30.0000 mL | Freq: Once | ORAL | Status: AC
  Administered 2017-09-26: 30 mL via ORAL
  Filled 2017-09-26: qty 30

## 2017-09-26 NOTE — Discharge Instructions (Signed)
Try miralax 1 scoop in 8 oz of water a day for the next week or until you start having significant bowel movements.  Return for worsening pain, fever, inability to eat or drink.

## 2017-09-26 NOTE — ED Triage Notes (Signed)
C/o abd pain x 1 week-denies n/v/d-NAD-steady gait

## 2017-09-26 NOTE — ED Provider Notes (Signed)
[facility] EMERGENCY DEPARTMENT Provider Note   CSN: [id number] Arrival date & time: 09/26/17  1912     History   Chief Complaint Chief Complaint  Patient presents with  . Abdominal Pain    HPI Anna Koch is a 27 y.o. female.  27 yo F with a chief complaint of recurrent abdominal pain.  This is right-sided seems to come and go.  Is been going on for at least the past couple weeks.  She is had a history of ovarian cyst on that side but thinks it may be feels different.  She saw her OB/GYN earlier in the week and they did a pelvic exam and started on antibiotics for a possible pelvic infection.  She does not feel that her symptoms had improved.  She denies vaginal bleeding or discharge.  She describes the pain is crampy.  Denies radiation.  Denies fevers or vomiting.  The history is provided by the patient.  Abdominal Pain   This is a new problem. The current episode started more than 1 week ago. The problem occurs constantly. The problem has not changed since onset.The pain is associated with an unknown factor. The pain is located in the RLQ and RUQ. The quality of the pain is cramping and colicky. The pain is at a severity of 6/10. The pain is moderate. Pertinent negatives include fever, nausea, vomiting, dysuria, headaches, arthralgias and myalgias. Nothing aggravates the symptoms. Nothing relieves the symptoms.    Past Medical History:  Diagnosis Date  . Ovarian cyst     There are no active problems to display for this patient.   Past Surgical History:  Procedure Laterality Date  . CARPAL TUNNEL RELEASE       OB History   None      Home Medications    Prior to Admission medications   Medication Sig Start Date End Date Taking? Authorizing Provider  baclofen (LIORESAL) 10 MG tablet Take 1 tablet (10 mg total) by mouth 3 (three) times daily. 07/27/16   Harris, Cammy Copa, PA-C  guaiFENesin-codeine 100-10 MG/5ML syrup Take 5 mLs by mouth at bedtime as  needed for cough. 06/19/17   Dartha Lodge, PA-C  meloxicam (MOBIC) 15 MG tablet Take 1 tablet (15 mg total) by mouth daily. Take 1 daily with food. 07/27/16   Arthor Captain, PA-C    Family History No family history on file.  Social History Social History   Tobacco Use  . Smoking status: Current Some Day Smoker    Types: Cigarettes  . Smokeless tobacco: Never Used  Substance Use Topics  . Alcohol use: Yes    Comment: occ  . Drug use: No     Allergies   Penicillins   Review of Systems Review of Systems  Constitutional: Negative for chills and fever.  HENT: Negative for congestion and rhinorrhea.   Eyes: Negative for redness and visual disturbance.  Respiratory: Negative for shortness of breath and wheezing.   Cardiovascular: Negative for chest pain and palpitations.  Gastrointestinal: Positive for abdominal pain. Negative for nausea and vomiting.  Genitourinary: Negative for dysuria and urgency.  Musculoskeletal: Negative for arthralgias and myalgias.  Skin: Negative for pallor and wound.  Neurological: Negative for dizziness and headaches.     Physical Exam Updated Vital Signs BP (!) 148/90 (BP Location: Left Arm)   Pulse 88   Temp 98.4 F (36.9 C) (Oral)   Resp 18   Ht 5\' 7"  (1.702 m)   Wt 106.6 kg (235 lb)  SpO2 100%   BMI 36.81 kg/m   Physical Exam  Constitutional: She is oriented to person, place, and time. She appears well-developed and well-nourished. No distress.  HENT:  Head: Normocephalic and atraumatic.  Eyes: Pupils are equal, round, and reactive to light. EOM are normal.  Neck: Normal range of motion. Neck supple.  Cardiovascular: Normal rate and regular rhythm. Exam reveals no gallop and no friction rub.  No murmur heard. Pulmonary/Chest: Effort normal. She has no wheezes. She has no rales.  Abdominal: Soft. She exhibits no distension. There is no tenderness.    Musculoskeletal: She exhibits no edema or tenderness.  Neurological: She is  alert and oriented to person, place, and time.  Skin: Skin is warm and dry. She is not diaphoretic.  Psychiatric: She has a normal mood and affect. Her behavior is normal.  Nursing note and vitals reviewed.    ED Treatments / Results  Labs (all labs ordered are listed, but only abnormal results are displayed) Labs Reviewed  COMPREHENSIVE METABOLIC PANEL - Abnormal; Notable for the following components:      Result Value   Potassium 3.2 (*)    Calcium 8.6 (*)    Total Protein 8.4 (*)    All other components within normal limits  CBC - Abnormal; Notable for the following components:   RBC 5.35 (*)    All other components within normal limits  URINALYSIS, ROUTINE W REFLEX MICROSCOPIC  PREGNANCY, URINE  LIPASE, BLOOD    EKG None  Radiology No results found.  Procedures Procedures (including critical care time)  Medications Ordered in ED Medications  gi cocktail (Maalox,Lidocaine,Donnatal) (has no administration in time range)     Initial Impression / Assessment and Plan / ED Course  I have reviewed the triage vital signs and the nursing notes.  Pertinent labs & imaging results that were available during my care of the patient were reviewed by me and considered in my medical decision making (see chart for details).     27 yo F with a chief complaint of right-sided abdominal pain.  This been going on for a couple weeks, described as colicky.  She has not had fevers or bit vomiting.  She is well-appearing and nontoxic.  There is no pain at McBurney's point has a negative Murphy sign no right upper quadrant tenderness.  Her pain is more lateral and along the level of the umbilicus.  I feel that is unlikely the patient has a appendicitis or cholecystitis based on 2 weeks of symptoms without fevers or vomiting.  The patient's pain is above the umbilicus and she was recently treated for PID so I will defer pelvic exam.  She has no leukocytosis, she has a mild hypokalemia which  appears to be her baseline.  No LFT elevation.  Lipase is normal.  At this point I will have her do a trial of MiraLAX follow-up with her OB/GYN and PCP.  9:04 PM:  I have discussed the diagnosis/risks/treatment options with the patient and believe the pt to be eligible for discharge home to follow-up with PCP, OBGYN. We also discussed returning to the ED immediately if new or worsening sx occur. We discussed the sx which are most concerning (e.g., sudden worsening pain, fever, inability to tolerate by mouth) that necessitate immediate return. Medications administered to the patient during their visit and any new prescriptions provided to the patient are listed below.  Medications given during this visit Medications  gi cocktail (Maalox,Lidocaine,Donnatal) (has no administration in time  range)      The patient appears reasonably screen and/or stabilized for discharge and I doubt any other medical condition or other [facility] requiring further screening, evaluation, or treatment in the ED at this time prior to discharge.    Final Clinical Impressions(s) / ED Diagnoses   Final diagnoses:  Right sided abdominal pain    ED Discharge Orders    None       Melene Plan, DO 09/26/17 2104

## 2019-03-15 ENCOUNTER — Encounter (HOSPITAL_BASED_OUTPATIENT_CLINIC_OR_DEPARTMENT_OTHER): Payer: Self-pay | Admitting: Emergency Medicine

## 2019-03-15 ENCOUNTER — Other Ambulatory Visit: Payer: Self-pay

## 2019-03-15 ENCOUNTER — Emergency Department (HOSPITAL_BASED_OUTPATIENT_CLINIC_OR_DEPARTMENT_OTHER)
Admission: EM | Admit: 2019-03-15 | Discharge: 2019-03-16 | Disposition: A | Discharge status: Home / Self Care | Payer: 59 | Attending: Emergency Medicine | Admitting: Emergency Medicine

## 2019-03-15 DIAGNOSIS — Z793 Long term (current) use of hormonal contraceptives: Secondary | ICD-10-CM | POA: Insufficient documentation

## 2019-03-15 DIAGNOSIS — M545 Low back pain, unspecified: Secondary | ICD-10-CM

## 2019-03-15 DIAGNOSIS — R300 Dysuria: Secondary | ICD-10-CM | POA: Insufficient documentation

## 2019-03-15 DIAGNOSIS — Z79899 Other long term (current) drug therapy: Secondary | ICD-10-CM | POA: Insufficient documentation

## 2019-03-15 DIAGNOSIS — F17210 Nicotine dependence, cigarettes, uncomplicated: Secondary | ICD-10-CM | POA: Insufficient documentation

## 2019-03-15 LAB — URINALYSIS, MICROSCOPIC (REFLEX)

## 2019-03-15 LAB — WET PREP, GENITAL
Clue Cells Wet Prep HPF POC: NONE SEEN
Sperm: NONE SEEN
Trich, Wet Prep: NONE SEEN
Yeast Wet Prep HPF POC: NONE SEEN

## 2019-03-15 LAB — URINALYSIS, ROUTINE W REFLEX MICROSCOPIC
Bilirubin Urine: NEGATIVE
Glucose, UA: NEGATIVE mg/dL
Ketones, ur: 15 mg/dL — AB
Leukocytes,Ua: NEGATIVE
Nitrite: NEGATIVE
Protein, ur: NEGATIVE mg/dL
Specific Gravity, Urine: 1.02 (ref 1.005–1.030)
pH: 7.5 (ref 5.0–8.0)

## 2019-03-15 LAB — PREGNANCY, URINE: Preg Test, Ur: NEGATIVE

## 2019-03-15 MED ORDER — NAPROXEN 250 MG PO TABS
500.0000 mg | ORAL_TABLET | Freq: Once | ORAL | Status: AC
  Administered 2019-03-16: 500 mg via ORAL
  Filled 2019-03-15: qty 2

## 2019-03-15 MED ORDER — MELOXICAM 15 MG PO TABS: ORAL_TABLET | ORAL | 0 refills | Status: DC

## 2019-03-15 NOTE — ED Provider Notes (Signed)
[facility] Provider Note: Anna Dell, MD, FACEP  CSN: [id number] MRN: [id number] ARRIVAL: 03/15/19 at 2245 ROOM: [facility]10/[facility]10   CHIEF COMPLAINT  Back Pain   HISTORY OF PRESENT ILLNESS  03/15/19 11:06 PM Anna Koch is a 28 y.o. female with a 2-week history of low back pain radiating around to her iliac crest bilaterally.  The pain is aching in nature and she rates it as a 6 out of 10.  It is somewhat worse with movement and partially resolved with ibuprofen.  She has had some mild burning with urination but no fever or chills.  She has had some mild vaginal spotting.   Past Medical History:  Diagnosis Date  . Ovarian cyst     Past Surgical History:  Procedure Laterality Date  . CARPAL TUNNEL RELEASE      No family history on file.  Social History   Tobacco Use  . Smoking status: Current Some Day Smoker    Types: Cigarettes  . Smokeless tobacco: Never Used  Substance Use Topics  . Alcohol use: Yes    Comment: occ  . Drug use: No    Prior to Admission medications   Medication Sig Start Date End Date Taking? Authorizing Provider  Norethin Ace-Eth Estrad-FE 1-20 MG-MCG(24) CHEW Chew by mouth. 09/24/18  Yes [provider]  baclofen (LIORESAL) 10 MG tablet Take 1 tablet (10 mg total) by mouth 3 (three) times daily. 07/27/16   Harris, Cammy Copa, PA-C  guaiFENesin-codeine 100-10 MG/5ML syrup Take 5 mLs by mouth at bedtime as needed for cough. 06/19/17   Dartha Lodge, PA-C  meloxicam (MOBIC) 15 MG tablet Take 1 tablet (15 mg total) by mouth daily. Take 1 daily with food. 07/27/16   Arthor Captain, PA-C    Allergies Penicillins   REVIEW OF SYSTEMS  Negative except as noted here or in the History of Present Illness.   PHYSICAL EXAMINATION  Initial Vital Signs Blood pressure (!) 162/107, pulse 78, temperature 98.9 F (37.2 C), temperature source Oral, resp. rate 20, height 5\' 7"  (1.702 m), weight 104.3 kg, last menstrual period 02/19/2019,  SpO2 99 %.  Examination General: Well-developed, well-nourished female in no acute distress; appearance consistent with age of record HENT: normocephalic; atraumatic Eyes: pupils equal, round and reactive to light; extraocular muscles intact Neck: supple Heart: regular rate and rhythm Lungs: clear to auscultation bilaterally Abdomen: soft; nondistended; mild suprapubic tenderness; bowel sounds present Back: Mild paralumbar tenderness bilaterally GU: Normal external genitalia; blood-tinged vaginal discharge; no cervical motion tenderness; no adnexal tenderness Extremities: No deformity; full range of motion; pulses normal Neurologic: Awake, alert and oriented; motor function intact in all extremities and symmetric; no facial droop Skin: Warm and dry Psychiatric: Normal mood and affect   RESULTS  Summary of this visit's results, reviewed and interpreted by myself:   EKG Interpretation  Date/Time:    Ventricular Rate:    PR Interval:    QRS Duration:   QT Interval:    QTC Calculation:   R Axis:     Text Interpretation:        Laboratory Studies: Results for orders placed or performed during the hospital encounter of 03/15/19 (from the past 24 hour(s))  Pregnancy, urine     Status: None   Collection Time: 03/15/19 11:02 PM  Result Value Ref Range   Preg Test, Ur NEGATIVE NEGATIVE  Urinalysis, Routine w reflex microscopic     Status: Abnormal   Collection Time: 03/15/19 11:02 PM  Result Value  Ref Range   Color, Urine YELLOW YELLOW   APPearance CLOUDY (A) CLEAR   Specific Gravity, Urine 1.020 1.005 - 1.030   pH 7.5 5.0 - 8.0   Glucose, UA NEGATIVE NEGATIVE mg/dL   Hgb urine dipstick MODERATE (A) NEGATIVE   Bilirubin Urine NEGATIVE NEGATIVE   Ketones, ur 15 (A) NEGATIVE mg/dL   Protein, ur NEGATIVE NEGATIVE mg/dL   Nitrite NEGATIVE NEGATIVE   Leukocytes,Ua NEGATIVE NEGATIVE  Urinalysis, Microscopic (reflex)     Status: Abnormal   Collection Time: 03/15/19 11:02 PM   Result Value Ref Range   RBC / HPF 0-5 0 - 5 RBC/hpf   WBC, UA 0-5 0 - 5 WBC/hpf   Bacteria, UA MANY (A) NONE SEEN   Squamous Epithelial / LPF 6-10 0 - 5  Wet prep, genital     Status: Abnormal   Collection Time: 03/15/19 11:28 PM  Result Value Ref Range   Yeast Wet Prep HPF POC NONE SEEN NONE SEEN   Trich, Wet Prep NONE SEEN NONE SEEN   Clue Cells Wet Prep HPF POC NONE SEEN NONE SEEN   WBC, Wet Prep HPF POC FEW (A) NONE SEEN   Sperm NONE SEEN    Imaging Studies: No results found.  ED COURSE and MDM  Nursing notes, initial and subsequent vitals signs, including pulse oximetry, reviewed and interpreted by myself.  Vitals:   03/15/19 2257 03/15/19 2258  BP: (!) 162/107   Pulse: 78   Resp: 20   Temp: 98.9 F (37.2 C)   TempSrc: Oral   SpO2: 99%   Weight:  104.3 kg  Height:  5\' 7"  (1.702 m)   Medications  naproxen (NAPROSYN) tablet 500 mg (has no administration in time range)    Patient advised of reassuring lab studies.  GC and Chlamydia are pending.  This may just represent musculoskeletal back pain.  We will treat with an NSAID.  PROCEDURES  Procedures   ED DIAGNOSES     ICD-10-CM   1. Acute bilateral low back pain without sciatica  M54.5        Jacy Brocker, MD 03/15/19 2352

## 2019-03-15 NOTE — ED Triage Notes (Signed)
Bilateral lower abd pain for 2 weeks.  Sx increased tonight. Denies vaginal discharge.

## 2019-03-19 LAB — GC/CHLAMYDIA PROBE AMP (~~LOC~~) NOT AT ARMC
Chlamydia: NEGATIVE
Neisseria Gonorrhea: NEGATIVE

## 2020-03-26 ENCOUNTER — Other Ambulatory Visit: Payer: Self-pay

## 2020-03-26 ENCOUNTER — Encounter (HOSPITAL_BASED_OUTPATIENT_CLINIC_OR_DEPARTMENT_OTHER): Payer: Self-pay | Admitting: Emergency Medicine

## 2020-03-26 ENCOUNTER — Emergency Department (HOSPITAL_BASED_OUTPATIENT_CLINIC_OR_DEPARTMENT_OTHER)
Admission: EM | Admit: 2020-03-26 | Discharge: 2020-03-26 | Disposition: A | Discharge status: Home / Self Care | Payer: HRSA Program | Attending: Emergency Medicine | Admitting: Emergency Medicine

## 2020-03-26 DIAGNOSIS — Z20822 Contact with and (suspected) exposure to covid-19: Secondary | ICD-10-CM

## 2020-03-26 DIAGNOSIS — U071 COVID-19: Secondary | ICD-10-CM | POA: Insufficient documentation

## 2020-03-26 DIAGNOSIS — R519 Headache, unspecified: Secondary | ICD-10-CM | POA: Diagnosis present

## 2020-03-26 DIAGNOSIS — F17210 Nicotine dependence, cigarettes, uncomplicated: Secondary | ICD-10-CM | POA: Diagnosis not present

## 2020-03-26 LAB — SARS CORONAVIRUS 2 (TAT 6-24 HRS): SARS Coronavirus 2: POSITIVE — AB

## 2020-03-26 MED ORDER — BENZONATATE 100 MG PO CAPS: 100.0000 mg | ORAL_CAPSULE | Freq: Three times a day (TID) | ORAL | 0 refills | Status: AC

## 2020-03-26 MED ORDER — KETOROLAC TROMETHAMINE 30 MG/ML IJ SOLN
30.0000 mg | Freq: Once | INTRAMUSCULAR | Status: AC
  Administered 2020-03-26: 30 mg via INTRAMUSCULAR
  Filled 2020-03-26: qty 1

## 2020-03-26 MED ORDER — NAPROXEN 500 MG PO TABS: 500.0000 mg | ORAL_TABLET | Freq: Two times a day (BID) | ORAL | 0 refills | Status: DC

## 2020-03-26 MED ORDER — ONDANSETRON 4 MG PO TBDP: 4.0000 mg | ORAL_TABLET | Freq: Three times a day (TID) | ORAL | 0 refills | Status: AC | PRN
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
## 2020-03-26 NOTE — Discharge Instructions (Signed)
Tessalon Perles for cough Naproxen for body aches and headache Zofran for nausea or vomiting  Drink plenty fluids and rest.  Return for new worsening symptoms.  Your Covid test results will be on MyChart.  Check there for official results

## 2020-03-26 NOTE — ED Notes (Signed)
Discharge instructions reviewed.  Verbalized understanding.  Unable to get a signature due to faulty equipment.
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
## 2020-03-27 ENCOUNTER — Telehealth (HOSPITAL_BASED_OUTPATIENT_CLINIC_OR_DEPARTMENT_OTHER): Payer: Self-pay | Admitting: Physician Assistant

## 2020-03-27 NOTE — Telephone Encounter (Signed)
Attempted to contact patient about POSITIVE COVID results.  Unable to leave VM.

## 2020-07-15 ENCOUNTER — Emergency Department (HOSPITAL_BASED_OUTPATIENT_CLINIC_OR_DEPARTMENT_OTHER): Payer: No Typology Code available for payment source

## 2020-07-15 ENCOUNTER — Other Ambulatory Visit: Payer: Self-pay

## 2020-07-15 ENCOUNTER — Emergency Department (HOSPITAL_BASED_OUTPATIENT_CLINIC_OR_DEPARTMENT_OTHER)
Admission: EM | Admit: 2020-07-15 | Discharge: 2020-07-15 | Disposition: A | Discharge status: Home / Self Care | Payer: No Typology Code available for payment source | Attending: Emergency Medicine | Admitting: Emergency Medicine

## 2020-07-15 ENCOUNTER — Encounter (HOSPITAL_BASED_OUTPATIENT_CLINIC_OR_DEPARTMENT_OTHER): Payer: Self-pay

## 2020-07-15 DIAGNOSIS — F17210 Nicotine dependence, cigarettes, uncomplicated: Secondary | ICD-10-CM | POA: Insufficient documentation

## 2020-07-15 DIAGNOSIS — S199XXA Unspecified injury of neck, initial encounter: Secondary | ICD-10-CM | POA: Diagnosis present

## 2020-07-15 DIAGNOSIS — S161XXA Strain of muscle, fascia and tendon at neck level, initial encounter: Secondary | ICD-10-CM

## 2020-07-15 DIAGNOSIS — Y92410 Unspecified street and highway as the place of occurrence of the external cause: Secondary | ICD-10-CM | POA: Diagnosis not present

## 2020-07-15 DIAGNOSIS — R519 Headache, unspecified: Secondary | ICD-10-CM | POA: Insufficient documentation

## 2020-07-15 LAB — PREGNANCY, URINE: Preg Test, Ur: NEGATIVE

## 2020-07-15 MED ORDER — ACETAMINOPHEN 325 MG PO TABS
650.0000 mg | ORAL_TABLET | Freq: Once | ORAL | Status: AC
  Administered 2020-07-15: 650 mg via ORAL
  Filled 2020-07-15: qty 2

## 2020-07-15 MED ORDER — CYCLOBENZAPRINE HCL 10 MG PO TABS: 10.0000 mg | ORAL_TABLET | Freq: Two times a day (BID) | ORAL | 0 refills | Status: AC | PRN

## 2020-07-15 MED ORDER — METHOCARBAMOL 500 MG PO TABS
1000.0000 mg | ORAL_TABLET | Freq: Once | ORAL | Status: AC
  Administered 2020-07-15: 1000 mg via ORAL
  Filled 2020-07-15: qty 2
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
## 2020-09-15 ENCOUNTER — Emergency Department (HOSPITAL_BASED_OUTPATIENT_CLINIC_OR_DEPARTMENT_OTHER): Payer: Self-pay

## 2020-09-15 ENCOUNTER — Other Ambulatory Visit: Payer: Self-pay

## 2020-09-15 ENCOUNTER — Encounter (HOSPITAL_BASED_OUTPATIENT_CLINIC_OR_DEPARTMENT_OTHER): Payer: Self-pay | Admitting: *Deleted

## 2020-09-15 ENCOUNTER — Emergency Department (HOSPITAL_BASED_OUTPATIENT_CLINIC_OR_DEPARTMENT_OTHER)
Admission: EM | Admit: 2020-09-15 | Discharge: 2020-09-15 | Disposition: A | Discharge status: Home / Self Care | Payer: Self-pay | Attending: Emergency Medicine | Admitting: Emergency Medicine

## 2020-09-15 DIAGNOSIS — F17210 Nicotine dependence, cigarettes, uncomplicated: Secondary | ICD-10-CM | POA: Insufficient documentation

## 2020-09-15 DIAGNOSIS — M25571 Pain in right ankle and joints of right foot: Secondary | ICD-10-CM | POA: Insufficient documentation

## 2020-09-15 DIAGNOSIS — M79671 Pain in right foot: Secondary | ICD-10-CM

## 2020-09-15 NOTE — ED Notes (Signed)
2 plus pedal pulse easily noted on rt foot, color wnl, capillary refill wnl, no obvious deformity or swelling noted at this time.
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
## 2020-09-15 NOTE — ED Notes (Signed)
Patient transported to X-ray 

## 2020-09-15 NOTE — ED Provider Notes (Signed)
[facility] EMERGENCY DEPARTMENT Provider Note   CSN: [id number] Arrival date & time: 09/15/20  7510     History Chief Complaint  Patient presents with   Ankle Pain    Anna Koch is a 30 y.o. female.  The history is provided by the patient, medical records and a parent. No language interpreter was used.  Ankle Pain Location:  Ankle and foot Time since incident:  2 days Ankle location:  R ankle Foot location:  R foot Pain details:    Quality:  Aching   Severity:  Moderate   Onset quality:  Gradual   Duration:  2 days   Timing:  Constant   Progression:  Unchanged Chronicity:  New Foreign body present:  No foreign bodies Tetanus status:  Unknown Prior injury to area:  Unable to specify Relieved by:  Nothing Worsened by:  Bearing weight Ineffective treatments:  None tried Associated symptoms: no back pain, no decreased ROM, no fatigue, no fever, no itching, no muscle weakness, no neck pain, no numbness, no stiffness, no swelling and no tingling   Risk factors: no known bone disorder       Past Medical History:  Diagnosis Date   Ovarian cyst     There are no problems to display for this patient.   Past Surgical History:  Procedure Laterality Date   CARPAL TUNNEL RELEASE       OB History   No obstetric history on file.     History reviewed. No pertinent family history.  Social History   Tobacco Use   Smoking status: Some Days    Pack years: 0.00    Types: Cigarettes   Smokeless tobacco: Never  Substance Use Topics   Alcohol use: Yes    Comment: occ   Drug use: No    Home Medications Prior to Admission medications   Medication Sig Start Date End Date Taking? Authorizing Provider  albuterol (VENTOLIN HFA) 108 (90 Base) MCG/ACT inhaler Inhale into the lungs. 06/10/19   [provider]  amLODipine (NORVASC) 10 MG tablet Take 1 tablet by mouth daily. 04/23/19   [provider]  benzonatate (TESSALON) 100 MG capsule  Take 1 capsule (100 mg total) by mouth every 8 (eight) hours. 03/26/20   Henderly, Britni A, PA-C  cyclobenzaprine (FLEXERIL) 10 MG tablet Take 1 tablet (10 mg total) by mouth 2 (two) times daily as needed for muscle spasms. 07/15/20   Tilden Fossa, MD  ferrous sulfate 325 (65 FE) MG tablet Take by mouth. 05/03/19   [provider]  fluticasone (FLONASE) 50 MCG/ACT nasal spray Place into the nose. 12/26/19 12/25/20  [provider]  levonorgestrel (MIRENA) 20 MCG/DAY IUD by Intrauterine route. 10/11/13   [provider]  meloxicam (MOBIC) 15 MG tablet Take 1 tablet daily as needed for pain. 03/15/19   Molpus, John, MD  naproxen (NAPROSYN) 500 MG tablet Take 1 tablet (500 mg total) by mouth 2 (two) times daily. 03/26/20   Henderly, Britni A, PA-C  Norethin Ace-Eth Estrad-FE 1-20 MG-MCG(24) CHEW Chew by mouth. 09/24/18   [provider]  norethindrone (MICRONOR) 0.35 MG tablet Take 1 tablet by mouth daily. 09/26/19   [provider]  ondansetron (ZOFRAN ODT) 4 MG disintegrating tablet Take 1 tablet (4 mg total) by mouth every 8 (eight) hours as needed for nausea or vomiting. 03/26/20   Henderly, Britni A, PA-C    Allergies    Penicillins  Review of Systems   Review of Systems  Constitutional:  Negative for chills, fatigue and fever.  HENT:  Negative for congestion.   Respiratory:  Negative for cough, chest tightness and shortness of breath.   Cardiovascular:  Negative for chest pain.  Genitourinary:  Negative for dysuria.  Musculoskeletal:  Negative for back pain, neck pain, neck stiffness and stiffness.  Skin:  Negative for color change, itching, rash and wound.  Neurological:  Negative for headaches.  Psychiatric/Behavioral:  Negative for agitation.   All other systems reviewed and are negative.  Physical Exam Updated Vital Signs BP (!) 139/92 (BP Location: Right Arm)   Pulse 76   Temp 98.6 F (37 C) (Oral)   Resp 16   Ht 5\' 7"  (1.702 m)   Wt  98 kg   SpO2 100%   BMI 33.83 kg/m   Physical Exam Vitals and nursing note reviewed.  Constitutional:      General: She is not in acute distress.    Appearance: She is well-developed. She is not ill-appearing, toxic-appearing or diaphoretic.  HENT:     Head: Normocephalic and atraumatic.  Eyes:     Conjunctiva/sclera: Conjunctivae normal.  Cardiovascular:     Rate and Rhythm: Normal rate and regular rhythm.     Heart sounds: No murmur heard. Pulmonary:     Effort: Pulmonary effort is normal. No respiratory distress.     Breath sounds: Normal breath sounds. No wheezing, rhonchi or rales.  Chest:     Chest wall: No tenderness.  Abdominal:     Palpations: Abdomen is soft.     Tenderness: There is no abdominal tenderness.  Musculoskeletal:        General: Tenderness present. No swelling or deformity.     Cervical back: Neck supple.     Right lower leg: No edema.     Left lower leg: No edema.     Right ankle: No swelling, deformity, ecchymosis or lacerations. Tenderness present.     Right Achilles Tendon: No tenderness.     Right foot: Normal capillary refill. Tenderness present. No swelling, laceration or crepitus. Normal pulse.  Skin:    General: Skin is warm and dry.     Capillary Refill: Capillary refill takes less than 2 seconds.     Findings: No erythema or rash.  Neurological:     General: No focal deficit present.     Mental Status: She is alert.     Sensory: No sensory deficit.     Motor: No weakness.  Psychiatric:        Mood and Affect: Mood normal.    ED Results / Procedures / Treatments   Labs (all labs ordered are listed, but only abnormal results are displayed) Labs Reviewed - No data to display  EKG None  Radiology DG Ankle Complete Right  Result Date: 09/15/2020 CLINICAL DATA:  Rt foot pain, onset this past Sunday. Unknown if injury, may have twisted rt ankle. Having worsening pain, radiates to rt tibial area EXAM: RIGHT ANKLE - COMPLETE 3+ VIEW;  RIGHT FOOT COMPLETE - 3+ VIEW COMPARISON:  None. FINDINGS: Right ankle: There is no evidence of fracture, dislocation, or joint effusion. There is no evidence of arthropathy or other focal bone abnormality. Soft tissues are unremarkable. Right foot: There is no evidence of acute fracture or dislocation. No erosive osseous lesion. No significant degenerative change. Regional soft tissues are unremarkable. IMPRESSION: Negative radiographs of the right ankle and foot. Electronically Signed   By: Tuesday M.D.   On: 09/15/2020 09:17  DG Foot Complete Right  Result Date: 09/15/2020 CLINICAL DATA:  Rt foot pain, onset this past Sunday. Unknown if injury, may have twisted rt ankle. Having worsening pain, radiates to rt tibial area EXAM: RIGHT ANKLE - COMPLETE 3+ VIEW; RIGHT FOOT COMPLETE - 3+ VIEW COMPARISON:  None. FINDINGS: Right ankle: There is no evidence of fracture, dislocation, or joint effusion. There is no evidence of arthropathy or other focal bone abnormality. Soft tissues are unremarkable. Right foot: There is no evidence of acute fracture or dislocation. No erosive osseous lesion. No significant degenerative change. Regional soft tissues are unremarkable. IMPRESSION: Negative radiographs of the right ankle and foot. Electronically Signed   By: Emmaline KluverNancy  Ballantyne M.D.   On: 09/15/2020 09:17    Procedures Procedures   Medications Ordered in ED Medications - No data to display  ED Course  I have reviewed the triage vital signs and the nursing notes.  Pertinent labs & imaging results that were available during my care of the patient were reviewed by me and considered in my medical decision making (see chart for details).    MDM Rules/Calculators/A&P                          Andrey CotaBrandi Shrum is a 30 y.o. female wit no significant past medical history who presents with right ankle and foot pain.  Patient reports thath for the last 2 days, has had pain in her right ankle and right foot  that is worse when she tries to walk on it.  She is unsure if she has an injury but she was on her feet all day over the weekend helping host a birthday party for her daughter.  She is started hurting some and into Monday and the pain is severe when stressed to walk on it.  No history of previous ankle injury or surgery.  No history of gout or infections in the joints.  No history of DVT or PE and she denies any swelling in the actual lower leg.  She denies any rashes or skin injuries.  Denies any other trauma to her knowledge.  She reports no fevers, chills, rash, pain in the knee, hip, abdomen or back.  No other complaints reported.   On exam, lungs clear and chest nontender.  Abdomen nontender.  Hip and knee nontender.  Patient has some tenderness in the very distal right tib-fib but none in the Achilles or calf area.  Tenderness present throughout the ankle as well as the more proximal foot.  No rashes seen.  No significant swelling seen.  Intact DP and PT pulse.  Intact sensation and strength in the toes.  Pain with ankle manipulation.  Clinically I suspect he sprained ankle however we will get x-rays to look for fractures in the ankle as well as the foot.  We had a shared decision conversation and agreed that given lack of any pain in the calf or swelling, low suspicion for DVT we will hold on ultrasound this time.  Low suspicion for septic joint or gout based on description of symptoms and lack of swelling.  Patient agrees with symptomatic management with brace, crutches, and outpatient follow-up if x-rays are reassuring.  Anticipate reassessment.  X-rays show no acute abnormalities including no fracture, dislocation, effusion, subcutaneous changes.  Suspect ankle sprain or other soft tissue injury.  Patient was placed into a boot and given crutches as I suspect it is a severe sprain.  Patient will follow-up  with podiatry or sports medicine and wheeze over-the-counter medications.  She will use  RICE therapy.  Patient and family agree with plan of care and we agreed to hold on ultrasounds, aspiration, or other significant work-up.  The and other questions or concerns and patient discharged in good condition.   Final Clinical Impression(s) / ED Diagnoses Final diagnoses:  Acute right ankle pain  Right foot pain    Rx / DC Orders ED Discharge Orders     None       Clinical Impression: 1. Acute right ankle pain   2. Right foot pain     Disposition: Discharge  Condition: Good  I have discussed the results, Dx and Tx plan with the pt(& family if present). He/she/they expressed understanding and agree(s) with the plan. Discharge instructions discussed at great length. Strict return precautions discussed and pt &/or family have verbalized understanding of the instructions. No further questions at time of discharge.    New Prescriptions   No medications on file    Follow Up: Myra Rude, MD [address] [place] [address] [phone]     Felecia Shelling, DPM [address] [place] [address] [phone]     [facility] [address] [id number] [address] [place] [address] [phone]       Deyton Ellenbecker, Canary Brim, MD 09/15/20 1002
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
## 2020-09-15 NOTE — ED Triage Notes (Signed)
Rt foot pain, onset this past Sunday. Unknown if injury, may have twisted rt ankle. Having worsening pain, radiates to rt tibial area

## 2020-10-02 ENCOUNTER — Emergency Department (HOSPITAL_BASED_OUTPATIENT_CLINIC_OR_DEPARTMENT_OTHER)
Admission: EM | Admit: 2020-10-02 | Discharge: 2020-10-02 | Disposition: A | Discharge status: Home / Self Care | Payer: Medicaid Other | Attending: Emergency Medicine | Admitting: Emergency Medicine

## 2020-10-02 ENCOUNTER — Encounter (HOSPITAL_BASED_OUTPATIENT_CLINIC_OR_DEPARTMENT_OTHER): Payer: Self-pay | Admitting: Emergency Medicine

## 2020-10-02 ENCOUNTER — Ambulatory Visit: Payer: Medicaid Other | Admitting: Family Medicine

## 2020-10-02 ENCOUNTER — Other Ambulatory Visit: Payer: Self-pay

## 2020-10-02 DIAGNOSIS — F17210 Nicotine dependence, cigarettes, uncomplicated: Secondary | ICD-10-CM | POA: Insufficient documentation

## 2020-10-02 DIAGNOSIS — N898 Other specified noninflammatory disorders of vagina: Secondary | ICD-10-CM | POA: Insufficient documentation

## 2020-10-02 LAB — PREGNANCY, URINE: Preg Test, Ur: NEGATIVE

## 2020-10-02 MED ORDER — VALACYCLOVIR HCL 1 G PO TABS: 1000.0000 mg | ORAL_TABLET | Freq: Two times a day (BID) | ORAL | 0 refills | Status: AC

## 2020-10-02 NOTE — Progress Notes (Deleted)
  Anna Koch - 30 y.o. female MRN [id number]  Date of birth: [birth year]  SUBJECTIVE:  Including CC & ROS.  No chief complaint on file.   Anna Koch is a 30 y.o. female that is  ***.  ***   Review of Systems See HPI   HISTORY: Past Medical, Surgical, Social, and Family History Reviewed & Updated per EMR.   Pertinent Historical Findings include:  Past Medical History:  Diagnosis Date   Ovarian cyst     Past Surgical History:  Procedure Laterality Date   CARPAL TUNNEL RELEASE      No family history on file.  Social History   Socioeconomic History   Marital status: Single    Spouse name: Not on file   Number of children: Not on file   Years of education: Not on file   Highest education level: Not on file  Occupational History   Not on file  Tobacco Use   Smoking status: Some Days    Pack years: 0.00    Types: Cigarettes   Smokeless tobacco: Never  Substance and Sexual Activity   Alcohol use: Yes    Comment: occ   Drug use: No   Sexual activity: Not on file  Other Topics Concern   Not on file  Social History Narrative   Not on file   Social Determinants of Health   Financial Resource Strain: Not on file  Food Insecurity: Not on file  Transportation Needs: Not on file  Physical Activity: Not on file  Stress: Not on file  Social Connections: Not on file  Intimate Partner Violence: Not on file     PHYSICAL EXAM:  VS: There were no vitals taken for this visit. Physical Exam Gen: NAD, alert, cooperative with exam, well-appearing MSK:  ***      ASSESSMENT & PLAN:   No problem-specific Assessment & Plan notes found for this encounter.
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
## 2020-10-02 NOTE — ED Triage Notes (Signed)
Reports an irritated area to vagina that showed up on Sunday after shaving.  Denies any discharge.

## 2020-10-02 NOTE — Discharge Instructions (Addendum)
It was a pleasure taking care of you today.  As discussed, your herpes culture is pending.  I am sending you home with a medication for herpes.  Take as prescribed and finish all medication.  You may use Vaseline as a barrier cream.  Over-the-counter ibuprofen or Tylenol as needed for pain.  Please follow-up with OB/GYN at your scheduled appointment next week.  Return to the ER for new or worsening symptoms.  Your pregnancy test was negative.

## 2020-10-05 LAB — HSV CULTURE AND TYPING

## 2021-01-04 ENCOUNTER — Emergency Department (HOSPITAL_BASED_OUTPATIENT_CLINIC_OR_DEPARTMENT_OTHER)
Admission: EM | Admit: 2021-01-04 | Discharge: 2021-01-04 | Disposition: A | Discharge status: Home / Self Care | Payer: No Typology Code available for payment source | Attending: Emergency Medicine | Admitting: Emergency Medicine

## 2021-01-04 ENCOUNTER — Emergency Department (HOSPITAL_BASED_OUTPATIENT_CLINIC_OR_DEPARTMENT_OTHER): Payer: No Typology Code available for payment source

## 2021-01-04 ENCOUNTER — Other Ambulatory Visit: Payer: Self-pay

## 2021-01-04 ENCOUNTER — Encounter (HOSPITAL_BASED_OUTPATIENT_CLINIC_OR_DEPARTMENT_OTHER): Payer: Self-pay | Admitting: *Deleted

## 2021-01-04 DIAGNOSIS — R109 Unspecified abdominal pain: Secondary | ICD-10-CM | POA: Diagnosis not present

## 2021-01-04 DIAGNOSIS — R519 Headache, unspecified: Secondary | ICD-10-CM | POA: Insufficient documentation

## 2021-01-04 DIAGNOSIS — M542 Cervicalgia: Secondary | ICD-10-CM | POA: Diagnosis not present

## 2021-01-04 DIAGNOSIS — F17210 Nicotine dependence, cigarettes, uncomplicated: Secondary | ICD-10-CM | POA: Diagnosis not present

## 2021-01-04 DIAGNOSIS — Y92410 Unspecified street and highway as the place of occurrence of the external cause: Secondary | ICD-10-CM | POA: Insufficient documentation

## 2021-01-04 DIAGNOSIS — M791 Myalgia, unspecified site: Secondary | ICD-10-CM | POA: Diagnosis not present

## 2021-01-04 DIAGNOSIS — T1490XA Injury, unspecified, initial encounter: Secondary | ICD-10-CM

## 2021-01-04 LAB — CBC WITH DIFFERENTIAL/PLATELET
Abs Immature Granulocytes: 0.03 10*3/uL (ref 0.00–0.07)
Basophils Absolute: 0 10*3/uL (ref 0.0–0.1)
Basophils Relative: 0 %
Eosinophils Absolute: 0.3 10*3/uL (ref 0.0–0.5)
Eosinophils Relative: 5 %
HCT: 36.3 % (ref 36.0–46.0)
Hemoglobin: 11 g/dL — ABNORMAL LOW (ref 12.0–15.0)
Immature Granulocytes: 0 %
Lymphocytes Relative: 26 %
Lymphs Abs: 1.7 10*3/uL (ref 0.7–4.0)
MCH: 23 pg — ABNORMAL LOW (ref 26.0–34.0)
MCHC: 30.3 g/dL (ref 30.0–36.0)
MCV: 75.9 fL — ABNORMAL LOW (ref 80.0–100.0)
Monocytes Absolute: 0.6 10*3/uL (ref 0.1–1.0)
Monocytes Relative: 8 %
Neutro Abs: 4.1 10*3/uL (ref 1.7–7.7)
Neutrophils Relative %: 61 %
Platelets: 290 10*3/uL (ref 150–400)
RBC: 4.78 MIL/uL (ref 3.87–5.11)
RDW: 15.4 % (ref 11.5–15.5)
WBC: 6.8 10*3/uL (ref 4.0–10.5)
nRBC: 0 % (ref 0.0–0.2)

## 2021-01-04 LAB — COMPREHENSIVE METABOLIC PANEL
ALT: 19 U/L (ref 0–44)
AST: 20 U/L (ref 15–41)
Albumin: 3.9 g/dL (ref 3.5–5.0)
Alkaline Phosphatase: 73 U/L (ref 38–126)
Anion gap: 8 (ref 5–15)
BUN: 16 mg/dL (ref 6–20)
CO2: 25 mmol/L (ref 22–32)
Calcium: 9.1 mg/dL (ref 8.9–10.3)
Chloride: 102 mmol/L (ref 98–111)
Creatinine, Ser: 0.86 mg/dL (ref 0.44–1.00)
GFR, Estimated: >60 mL/min (ref >60)
Glucose, Bld: 106 mg/dL — ABNORMAL HIGH (ref 70–99)
Potassium: 3 mmol/L — ABNORMAL LOW (ref 3.5–5.1)
Sodium: 135 mmol/L (ref 135–145)
Total Bilirubin: 0.2 mg/dL — ABNORMAL LOW (ref 0.3–1.2)
Total Protein: 8 g/dL (ref 6.5–8.1)

## 2021-01-04 LAB — PREGNANCY, URINE: Preg Test, Ur: NEGATIVE

## 2021-01-04 MED ORDER — POTASSIUM CHLORIDE 20 MEQ PO PACK
60.0000 meq | PACK | Freq: Every day | ORAL | Status: DC
  Administered 2021-01-04: 60 meq via ORAL
  Filled 2021-01-04: qty 3

## 2021-01-04 MED ORDER — MORPHINE SULFATE (PF) 4 MG/ML IV SOLN
4.0000 mg | Freq: Once | INTRAVENOUS | Status: AC
  Administered 2021-01-04: 4 mg via INTRAVENOUS
  Filled 2021-01-04: qty 1

## 2021-01-04 MED ORDER — IOHEXOL 300 MG/ML  SOLN
100.0000 mL | Freq: Once | INTRAMUSCULAR | Status: AC | PRN
  Administered 2021-01-04: 100 mL via INTRAVENOUS
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
## 2021-01-15 ENCOUNTER — Other Ambulatory Visit: Payer: Self-pay

## 2021-01-15 ENCOUNTER — Emergency Department (HOSPITAL_BASED_OUTPATIENT_CLINIC_OR_DEPARTMENT_OTHER)
Admission: EM | Admit: 2021-01-15 | Discharge: 2021-01-15 | Disposition: A | Discharge status: Home / Self Care | Payer: Medicaid Other | Attending: Emergency Medicine | Admitting: Emergency Medicine

## 2021-01-15 ENCOUNTER — Other Ambulatory Visit (HOSPITAL_BASED_OUTPATIENT_CLINIC_OR_DEPARTMENT_OTHER): Payer: Self-pay

## 2021-01-15 ENCOUNTER — Encounter (HOSPITAL_BASED_OUTPATIENT_CLINIC_OR_DEPARTMENT_OTHER): Payer: Self-pay

## 2021-01-15 DIAGNOSIS — F17210 Nicotine dependence, cigarettes, uncomplicated: Secondary | ICD-10-CM | POA: Insufficient documentation

## 2021-01-15 DIAGNOSIS — R102 Pelvic and perineal pain: Secondary | ICD-10-CM | POA: Diagnosis present

## 2021-01-15 DIAGNOSIS — N939 Abnormal uterine and vaginal bleeding, unspecified: Secondary | ICD-10-CM | POA: Diagnosis not present

## 2021-01-15 LAB — COMPREHENSIVE METABOLIC PANEL
ALT: 21 U/L (ref 0–44)
AST: 22 U/L (ref 15–41)
Albumin: 4.2 g/dL (ref 3.5–5.0)
Alkaline Phosphatase: 69 U/L (ref 38–126)
Anion gap: 8 (ref 5–15)
BUN: 12 mg/dL (ref 6–20)
CO2: 25 mmol/L (ref 22–32)
Calcium: 8.6 mg/dL — ABNORMAL LOW (ref 8.9–10.3)
Chloride: 103 mmol/L (ref 98–111)
Creatinine, Ser: 0.77 mg/dL (ref 0.44–1.00)
GFR, Estimated: >60 mL/min (ref >60)
Glucose, Bld: 104 mg/dL — ABNORMAL HIGH (ref 70–99)
Potassium: 3.3 mmol/L — ABNORMAL LOW (ref 3.5–5.1)
Sodium: 136 mmol/L (ref 135–145)
Total Bilirubin: 0.4 mg/dL (ref 0.3–1.2)
Total Protein: 8.3 g/dL — ABNORMAL HIGH (ref 6.5–8.1)

## 2021-01-15 LAB — CBC WITH DIFFERENTIAL/PLATELET
Abs Immature Granulocytes: 0.02 10*3/uL (ref 0.00–0.07)
Basophils Absolute: 0 10*3/uL (ref 0.0–0.1)
Basophils Relative: 0 %
Eosinophils Absolute: 0.2 10*3/uL (ref 0.0–0.5)
Eosinophils Relative: 3 %
HCT: 37.9 % (ref 36.0–46.0)
Hemoglobin: 11.6 g/dL — ABNORMAL LOW (ref 12.0–15.0)
Immature Granulocytes: 0 %
Lymphocytes Relative: 30 %
Lymphs Abs: 1.4 10*3/uL (ref 0.7–4.0)
MCH: 23 pg — ABNORMAL LOW (ref 26.0–34.0)
MCHC: 30.6 g/dL (ref 30.0–36.0)
MCV: 75 fL — ABNORMAL LOW (ref 80.0–100.0)
Monocytes Absolute: 0.4 10*3/uL (ref 0.1–1.0)
Monocytes Relative: 9 %
Neutro Abs: 2.7 10*3/uL (ref 1.7–7.7)
Neutrophils Relative %: 58 %
Platelets: 272 10*3/uL (ref 150–400)
RBC: 5.05 MIL/uL (ref 3.87–5.11)
RDW: 16.8 % — ABNORMAL HIGH (ref 11.5–15.5)
WBC: 4.7 10*3/uL (ref 4.0–10.5)
nRBC: 0 % (ref 0.0–0.2)

## 2021-01-15 LAB — URINALYSIS, MICROSCOPIC (REFLEX)
Squamous Epithelial / HPF: NONE SEEN (ref 0–5)
WBC, UA: NONE SEEN WBC/hpf (ref 0–5)

## 2021-01-15 LAB — URINALYSIS, ROUTINE W REFLEX MICROSCOPIC
Bilirubin Urine: NEGATIVE
Glucose, UA: NEGATIVE mg/dL
Ketones, ur: NEGATIVE mg/dL
Leukocytes,Ua: NEGATIVE
Nitrite: NEGATIVE
Protein, ur: NEGATIVE mg/dL
Specific Gravity, Urine: 1.015 (ref 1.005–1.030)
pH: 7 (ref 5.0–8.0)

## 2021-01-15 LAB — HCG, SERUM, QUALITATIVE: Preg, Serum: NEGATIVE

## 2021-01-15 LAB — LIPASE, BLOOD: Lipase: 28 U/L (ref 11–51)

## 2021-01-15 MED ORDER — SODIUM CHLORIDE 0.9 % IV BOLUS
1000.0000 mL | Freq: Once | INTRAVENOUS | Status: AC
  Administered 2021-01-15: 1000 mL via INTRAVENOUS

## 2021-01-15 MED ORDER — FAMOTIDINE IN NACL 20-0.9 MG/50ML-% IV SOLN
20.0000 mg | Freq: Once | INTRAVENOUS | Status: AC
  Administered 2021-01-15: 20 mg via INTRAVENOUS
  Filled 2021-01-15: qty 50

## 2021-01-15 MED ORDER — MORPHINE SULFATE (PF) 4 MG/ML IV SOLN
4.0000 mg | Freq: Once | INTRAVENOUS | Status: AC
  Administered 2021-01-15: 4 mg via INTRAVENOUS
  Filled 2021-01-15: qty 1

## 2021-01-15 MED ORDER — KETOROLAC TROMETHAMINE 15 MG/ML IJ SOLN
15.0000 mg | Freq: Once | INTRAMUSCULAR | Status: AC
  Administered 2021-01-15: 15 mg via INTRAVENOUS
  Filled 2021-01-15: qty 1

## 2021-01-15 MED ORDER — ALUM & MAG HYDROXIDE-SIMETH 200-200-20 MG/5ML PO SUSP
15.0000 mL | Freq: Once | ORAL | Status: DC
  Filled 2021-01-15: qty 30

## 2021-01-15 MED ORDER — NAPROXEN 375 MG PO TABS
375.0000 mg | ORAL_TABLET | Freq: Two times a day (BID) | ORAL | 0 refills | Status: AC
  Filled 2021-01-15: qty 14, 7d supply, fill #0

## 2021-01-15 MED ORDER — DICYCLOMINE HCL 10 MG PO CAPS
10.0000 mg | ORAL_CAPSULE | Freq: Once | ORAL | Status: AC
  Administered 2021-01-15: 10 mg via ORAL
  Filled 2021-01-15: qty 1

## 2021-01-15 MED ORDER — ONDANSETRON HCL 4 MG/2ML IJ SOLN
4.0000 mg | Freq: Once | INTRAMUSCULAR | Status: AC
  Administered 2021-01-15: 4 mg via INTRAVENOUS
  Filled 2021-01-15: qty 2

## 2021-01-15 MED ORDER — HYDROCODONE-ACETAMINOPHEN 5-325 MG PO TABS
1.0000 | ORAL_TABLET | ORAL | 0 refills | Status: DC | PRN
  Filled 2021-01-15: qty 5, 2d supply, fill #0

## 2021-01-15 NOTE — ED Provider Notes (Signed)
[facility] EMERGENCY DEPARTMENT Provider Note   CSN: [id number] Arrival date & time: 01/15/21  0818     History Chief Complaint  Patient presents with   Abdominal Pain    Anna Koch is a 30 y.o. female.  This is a 29 y.o. female with significant medical history as below, including leiomyoma who presents to the ED with complaint of pelvic cramping, vaginal bleeding.  Patient reports last menstrual period started on Wednesday, she is been having her typical vaginal bleeding associate with her menses.  Her pelvic cramping is worsened from her typical menstrual cycle, bilateral, pain to her proximal thigh.  Similar pain she is experienced in the past with fibroids.  Is undergone D&C in the past associated with her fibroids.  She has been taking 200 mg of Motrin and Tylenol intermittently for her symptoms.  Is not significant proved her symptoms.  No nausea, vomiting, chest pain, dyspnea, fevers or chills.  No change in bowel or bladder function.  No concern for STI.  No fatigue or weakness.  Does not believes she is pregnant.  She has appoint with OB/GYN on Tuesday  Location: Pelvic Duration: 3 days Onset: Gradual Timing: Constant Description: Sharp, stabbing Severity: Mild Exacerbating/Alleviating Factors: None identified Associated Symptoms: Vaginal bleeding Pertinent Negatives: See above    The history is provided by the patient and a relative.  Abdominal Pain Associated symptoms: vaginal bleeding   Associated symptoms: no chest pain, no chills, no cough, no fever, no hematuria, no nausea, no shortness of breath and no vomiting       Past Medical History:  Diagnosis Date   Ovarian cyst     There are no problems to display for this patient.   Past Surgical History:  Procedure Laterality Date   CARPAL TUNNEL RELEASE     DILATION AND CURETTAGE OF UTERUS     UTERINE FIBROID SURGERY       OB History   No obstetric history on file.     No family  history on file.  Social History   Tobacco Use   Smoking status: Some Days    Types: Cigarettes   Smokeless tobacco: Never  Substance Use Topics   Alcohol use: Yes    Comment: occ   Drug use: No    Home Medications Prior to Admission medications   Medication Sig Start Date End Date Taking? Authorizing Provider  amLODipine (NORVASC) 10 MG tablet Take 1 tablet by mouth daily. 04/23/19  Yes [provider]  HYDROcodone-acetaminophen (NORCO/VICODIN) 5-325 MG tablet Take 1 tablet by mouth every 4 (four) hours as needed. 01/15/21  Yes Tanda Rockers A, DO  naproxen (NAPROSYN) 375 MG tablet Take 1 tablet (375 mg total) by mouth 2 (two) times daily with a meal for 7 days. 01/15/21 01/22/21 Yes Tanda Rockers A, DO  albuterol (VENTOLIN HFA) 108 (90 Base) MCG/ACT inhaler Inhale into the lungs. 06/10/19   [provider]  benzonatate (TESSALON) 100 MG capsule Take 1 capsule (100 mg total) by mouth every 8 (eight) hours. 03/26/20   Henderly, Britni A, PA-C  cyclobenzaprine (FLEXERIL) 10 MG tablet Take 1 tablet (10 mg total) by mouth 2 (two) times daily as needed for muscle spasms. 07/15/20   Tilden Fossa, MD  ferrous sulfate 325 (65 FE) MG tablet Take by mouth. 05/03/19   [provider]  fluticasone (FLONASE) 50 MCG/ACT nasal spray Place into the nose. 12/26/19 12/25/20  [provider]  levonorgestrel (MIRENA) 20 MCG/DAY IUD by Intrauterine  route. 10/11/13   [provider]  Norethin Ace-Eth Estrad-FE 1-20 MG-MCG(24) CHEW Chew by mouth. 09/24/18   [provider]  norethindrone (MICRONOR) 0.35 MG tablet Take 1 tablet by mouth daily. 09/26/19   [provider]  ondansetron (ZOFRAN ODT) 4 MG disintegrating tablet Take 1 tablet (4 mg total) by mouth every 8 (eight) hours as needed for nausea or vomiting. 03/26/20   Henderly, Britni A, PA-C    Allergies    Penicillins  Review of Systems   Review of Systems  Constitutional:  Negative for chills  and fever.  HENT:  Negative for facial swelling and trouble swallowing.   Eyes:  Negative for photophobia and visual disturbance.  Respiratory:  Negative for cough and shortness of breath.   Cardiovascular:  Negative for chest pain and palpitations.  Gastrointestinal:  Negative for abdominal pain, nausea and vomiting.  Endocrine: Negative for polydipsia and polyuria.  Genitourinary:  Positive for pelvic pain and vaginal bleeding. Negative for difficulty urinating and hematuria.  Musculoskeletal:  Negative for gait problem and joint swelling.  Skin:  Negative for pallor and rash.  Neurological:  Negative for syncope and headaches.  Psychiatric/Behavioral:  Negative for agitation and confusion.    Physical Exam Updated Vital Signs BP (!) 143/99   Pulse 65   Temp 98.3 F (36.8 C)   Resp 16   Ht 5\' 7"  (1.702 m)   Wt 98 kg   LMP 01/13/2021 (Exact Date)   SpO2 100%   BMI 33.83 kg/m   Physical Exam Vitals and nursing note reviewed.  Constitutional:      General: She is not in acute distress.    Appearance: Normal appearance.  HENT:     Head: Normocephalic and atraumatic.     Right Ear: External ear normal.     Left Ear: External ear normal.     Nose: Nose normal.     Mouth/Throat:     Mouth: Mucous membranes are moist.  Eyes:     General: Vision grossly intact. Gaze aligned appropriately. No scleral icterus.       Right eye: No discharge.        Left eye: No discharge.     Comments: No conjunctival pallor  Cardiovascular:     Rate and Rhythm: Normal rate and regular rhythm.     Pulses: Normal pulses.     Heart sounds: Normal heart sounds.  Pulmonary:     Effort: Pulmonary effort is normal. No respiratory distress.     Breath sounds: Normal breath sounds.  Abdominal:     General: Abdomen is flat.     Palpations: Abdomen is soft.     Tenderness: There is no abdominal tenderness. There is no right CVA tenderness or left CVA tenderness.  Musculoskeletal:        General:  Normal range of motion.     Cervical back: Normal range of motion.     Right lower leg: No edema.     Left lower leg: No edema.  Skin:    General: Skin is warm and dry.     Capillary Refill: Capillary refill takes less than 2 seconds.  Neurological:     Mental Status: She is alert.  Psychiatric:        Mood and Affect: Mood normal.        Behavior: Behavior normal.    ED Results / Procedures / Treatments   Labs (all labs ordered are listed, but only abnormal results are displayed) Labs  Reviewed  CBC WITH DIFFERENTIAL/PLATELET - Abnormal; Notable for the following components:      Result Value   Hemoglobin 11.6 (*)    MCV 75.0 (*)    MCH 23.0 (*)    RDW 16.8 (*)    All other components within normal limits  COMPREHENSIVE METABOLIC PANEL - Abnormal; Notable for the following components:   Potassium 3.3 (*)    Glucose, Bld 104 (*)    Calcium 8.6 (*)    Total Protein 8.3 (*)    All other components within normal limits  LIPASE, BLOOD  HCG, SERUM, QUALITATIVE  URINALYSIS, ROUTINE W REFLEX MICROSCOPIC    EKG None  Radiology No results found.  Procedures Procedures   Medications Ordered in ED Medications  sodium chloride 0.9 % bolus 1,000 mL (0 mLs Intravenous Stopped 01/15/21 0950)  famotidine (PEPCID) IVPB 20 mg premix (0 mg Intravenous Stopped 01/15/21 0950)  ondansetron (ZOFRAN) injection 4 mg (4 mg Intravenous Given 01/15/21 0925)  dicyclomine (BENTYL) capsule 10 mg (10 mg Oral Given 01/15/21 0926)  ketorolac (TORADOL) 15 MG/ML injection 15 mg (15 mg Intravenous Given 01/15/21 0925)  morphine 4 MG/ML injection 4 mg (4 mg Intravenous Given 01/15/21 2951)    ED Course  I have reviewed the triage vital signs and the nursing notes.  Pertinent labs & imaging results that were available during my care of the patient were reviewed by me and considered in my medical decision making (see chart for details).    MDM Rules/Calculators/A&P                            CC:   This patient complains of pelvic pain, vaginal bleeding; this involves an extensive number of treatment options and is a complaint that carries with it a high risk of complications and morbidity. Vital signs were reviewed. Serious etiologies considered.  Abdomen is soft, not peritoneal, no rebound.   Record review:   Previous records obtained and reviewed   Additional history obtained from mother  Work up as above, notable for:   Labs & imaging results that were available during my care of the patient were reviewed by me and considered in my medical decision making.   Labs reviewed and are stable, HGB is 11.6, prior was 11. Preg negative.    Management: Given pepcid, toradol, zofran, morphine. IVF  Reassessment:  Pt does report pain has improved. Bleeding is stable to her typical menses. Discussed medication changes to aleve BID with meals. Norco PRN breakthrough pain. Close f/u with OBGYN, strict return precautions.     The patient improved significantly and was discharged in stable condition. Detailed discussions were had with the patient regarding current findings, and need for close f/u with PCP or on call doctor. The patient has been instructed to return immediately if the symptoms worsen in any way for re-evaluation. Patient verbalized understanding and is in agreement with current care plan. All questions answered prior to discharge.     This chart was dictated using voice recognition software.  Despite best efforts to proofread,  errors can occur which can change the documentation meaning.  Final Clinical Impression(s) / ED Diagnoses Final diagnoses:  Abnormal uterine bleeding (AUB)    Rx / DC Orders ED Discharge Orders          Ordered    HYDROcodone-acetaminophen (NORCO/VICODIN) 5-325 MG tablet  Every 4 hours PRN        01/15/21 1017  naproxen (NAPROSYN) 375 MG tablet  2 times daily with meals        01/15/21 1017             Sloan Leiter, DO 01/15/21 1018

## 2021-01-15 NOTE — ED Triage Notes (Signed)
Pt c/o lower abdominal pain for approximately one week. Was seen in ED for same and unable to follow up with OBGYN. Pt denies N/V/D. Pt states she is currently on her menses, passing large clots. Pt states she has hx of uterine fibroids.

## 2021-01-15 NOTE — ED Notes (Signed)
Patient given discharge instructions, all questions answered. Patient in possession of all belongings, directed to the discharge area  

## 2021-12-01 ENCOUNTER — Other Ambulatory Visit: Payer: Self-pay

## 2021-12-01 ENCOUNTER — Emergency Department (HOSPITAL_BASED_OUTPATIENT_CLINIC_OR_DEPARTMENT_OTHER)
Admission: EM | Admit: 2021-12-01 | Discharge: 2021-12-01 | Disposition: A | Discharge status: Home / Self Care | Payer: BC Managed Care – PPO | Attending: Emergency Medicine | Admitting: Emergency Medicine

## 2021-12-01 ENCOUNTER — Emergency Department (HOSPITAL_BASED_OUTPATIENT_CLINIC_OR_DEPARTMENT_OTHER): Payer: BC Managed Care – PPO

## 2021-12-01 ENCOUNTER — Encounter (HOSPITAL_BASED_OUTPATIENT_CLINIC_OR_DEPARTMENT_OTHER): Payer: Self-pay | Admitting: Urology

## 2021-12-01 DIAGNOSIS — M545 Low back pain, unspecified: Secondary | ICD-10-CM | POA: Insufficient documentation

## 2021-12-01 DIAGNOSIS — M549 Dorsalgia, unspecified: Secondary | ICD-10-CM | POA: Diagnosis present

## 2021-12-01 LAB — URINALYSIS, ROUTINE W REFLEX MICROSCOPIC
Bilirubin Urine: NEGATIVE
Glucose, UA: NEGATIVE mg/dL
Ketones, ur: NEGATIVE mg/dL
Leukocytes,Ua: NEGATIVE
Nitrite: NEGATIVE
Protein, ur: NEGATIVE mg/dL
Specific Gravity, Urine: 1.025 (ref 1.005–1.030)
pH: 6.5 (ref 5.0–8.0)

## 2021-12-01 LAB — URINALYSIS, MICROSCOPIC (REFLEX)

## 2021-12-01 MED ORDER — KETOROLAC TROMETHAMINE 15 MG/ML IJ SOLN
15.0000 mg | Freq: Once | INTRAMUSCULAR | Status: AC
  Administered 2021-12-01: 15 mg via INTRAMUSCULAR
  Filled 2021-12-01: qty 1

## 2021-12-01 MED ORDER — METHOCARBAMOL 500 MG PO TABS: 500.0000 mg | ORAL_TABLET | Freq: Two times a day (BID) | ORAL | 0 refills | Status: DC

## 2021-12-01 MED ORDER — OXYCODONE-ACETAMINOPHEN 5-325 MG PO TABS
1.0000 | ORAL_TABLET | Freq: Once | ORAL | Status: AC
  Administered 2021-12-01: 1 via ORAL
  Filled 2021-12-01: qty 1

## 2021-12-01 NOTE — ED Notes (Signed)
Rx x 1 given  Written and verbal inst to pt  Verbalized an understanding  To home with family  

## 2021-12-01 NOTE — Discharge Instructions (Addendum)
You were seen today for back pain. We did not identify any emergent cause for your symptoms. Your evaluation is most consistent with nonspecific muscle pain.   Plan and next steps:   The following may be helpful in managing your symptoms:   Pain- Lidocaine Patches  Apply to affected area for up to 12 hours at a time.   Pain/Fever- Adult Tylenol dosing:  650 mg orally every 4 to 6 hours as needed, MAX: 3250 mg/24 hours   (Extra-strength) 1000 mg orally every 6 hours as needed; MAX: 3000 mg/24 hours   Do not use if you have liver disease. Read the label on the bottle.   Pain/Fever- Adult Ibuprofen Dosing  200 to 400 mg orally every 4 to 6 hours as needed; MAX 1200 mg/day; do not take longer than 10 days   Do not use if you have kidney disease. Read the label on the bottle Additionally we will prescribe you muscle relaxer.  You may take this nightly 2 hours before you wish to go to sleep.  Do not drive for 12 hours after you take it Findings:  You may see all of your lab and imaging results utilizing our online portal! Look in this document or ask a team member for your mychart* access information. The most notable results have additionally been verbally communicated with you and your bedside family.    Follow-up Plan:   Follow up with the patient's normal primary care provider for monitoring of this condition within 48 hours.   Signs/Symptoms that would warrant return to the ED:  Please return to the ED if you experience worsening of symptoms or any abrupt changes in your health. Standard of care precautions for your chief complaint have already been verbally communicated with you. Always be on alert for fevers, chills, shortness of breath, chest pains, or sudden changes that warrant immediate evaluation.    Thank you for allowing [place] to be a part of you and your families' care.   Glyn Ade MD

## 2021-12-01 NOTE — ED Triage Notes (Signed)
States lower back pain x 3 days, Denies any urinary symptoms States pain radiating down bilateral legs and around into abdomen

## 2021-12-01 NOTE — ED Provider Notes (Signed)
[facility] EMERGENCY DEPARTMENT Provider Note   CSN: [id number] Arrival date & time: 12/01/21  1905     History Chief Complaint  Patient presents with   Back Pain    HPI Anna Koch is a 31 y.o. female presenting for acute on chronic back pain.  She endorses approximately 3 days of bilateral lower spinal back pain.  She denies fevers or chills nausea vomiting syncope shortness of breath.  Patient otherwise ambulatory tolerating p.o. intake.  She denies IV drug use, fevers, cancer, incontinence.   Patient's recorded medical, surgical, social, medication list and allergies were reviewed in the Snapshot window as part of the initial history.   Review of Systems   Review of Systems  Constitutional:  Negative for chills and fever.  HENT:  Negative for ear pain and sore throat.   Eyes:  Negative for pain and visual disturbance.  Respiratory:  Negative for cough and shortness of breath.   Cardiovascular:  Negative for chest pain and palpitations.  Gastrointestinal:  Negative for abdominal pain and vomiting.  Genitourinary:  Negative for dysuria and hematuria.  Musculoskeletal:  Positive for back pain. Negative for arthralgias.  Skin:  Negative for color change and rash.  Neurological:  Negative for seizures and syncope.  All other systems reviewed and are negative.   Physical Exam Updated Vital Signs BP 127/76 (BP Location: Right Arm)   Pulse 90   Temp 98.7 F (37.1 C) (Oral)   Resp 18   Ht 5\' 7"  (1.702 m)   Wt 98 kg   LMP 01/13/2021 (Exact Date)   SpO2 96%   BMI 33.84 kg/m  Physical Exam Vitals and nursing note reviewed.  Constitutional:      General: She is not in acute distress.    Appearance: She is well-developed.  HENT:     Head: Normocephalic and atraumatic.  Eyes:     Conjunctiva/sclera: Conjunctivae normal.  Cardiovascular:     Rate and Rhythm: Normal rate and regular rhythm.     Heart sounds: No murmur heard. Pulmonary:     Effort:  Pulmonary effort is normal. No respiratory distress.     Breath sounds: Normal breath sounds.  Abdominal:     Palpations: Abdomen is soft.     Tenderness: There is no abdominal tenderness.  Musculoskeletal:        General: No swelling.     Cervical back: Neck supple.  Skin:    General: Skin is warm and dry.     Capillary Refill: Capillary refill takes less than 2 seconds.  Neurological:     Mental Status: She is alert.  Psychiatric:        Mood and Affect: Mood normal.      ED Course/ Medical Decision Making/ A&P    Procedures Procedures   Medications Ordered in ED Medications  oxyCODONE-acetaminophen (PERCOCET/ROXICET) 5-325 MG per tablet 1 tablet (has no administration in time range)  ketorolac (TORADOL) 15 MG/ML injection 15 mg (has no administration in time range)   Medical Decision Making:   Anna Koch is a 31 y.o. female who presented to the ED today with acute lower back pain over the past 72 hours, detailed above.    Patient's presentation is complicated by their history of elevated BMI.  Patient placed on continuous vitals and telemetry monitoring while in ED which was reviewed periodically.   On my initial exam, the pt was with an intact neurologic exam, tolerating ambulation with an antalgic gait and p.o. intake without  difficulty.  Patient had no abnormal DTRs, no midline spinal tenderness.  Patient endorsing complete sensation of the perineum.  Patient without episodes of fecal or urinary incontinence.  Patient has no focal neurologic deficits and reassuring vital signs at this time.  No obvious physical abnormality or injury on exam. Notably, patient denies recent trauma, is afebrile, and denies IVDU.  Patient status post hysterectomy  Reviewed and confirmed nursing documentation for past medical history, family history, social history.    Initial Assessment:   With the patient's presentation of acute back pain in the above setting, most likely diagnosis  is musculoskeletal strain. Other diagnoses were considered including (but not limited to) underlying fracture, epidural hematoma, cauda equina syndrome, spinal stenosis, spinal malignancy. These are considered less likely due to history of present illness and physical exam findings.   In particular, lack of fever, substantial history of IV drug use, or substantial neurologic abnormality is less consistent with epidural abscess versus discitis or other spinal infection. In particular,  Initial Plan:  Multimodal pain control described and patient informed on safe usage.  Screening evaluation including below radiographic evaluation reviewed and grossly unremarkable at this time. Patient stable for continued outpatient evaluation and management of their musculoskeletal pains.  Patient referred back to primary care provider for continued evaluation and management.   Initial Study Results:   Radiology  DG Lumbar Spine Complete  Result Date: 12/01/2021 CLINICAL DATA:  Low back pain. EXAM: LUMBAR SPINE - COMPLETE 4+ VIEW COMPARISON:  CT chest abdomen pelvis dated 01/04/2021. FINDINGS: Five lumbar type vertebra. There is no acute fracture or subluxation of the lumbar spine. The vertebral body heights and disc spaces are maintained. The visualized posterior elements are intact. The soft tissues are unremarkable. IMPRESSION: Negative. Electronically Signed   By: Elgie Collard M.D.   On: 12/01/2021 19:38    Disposition:   Based on the above findings, I believe patient is stable for discharge.    Patient and family educated about specific return precautions for given chief complaint and symptoms.  Patient and family educated about follow-up with PCP and .  Patient and family expressed understanding of return precautions and need for follow-up. Patient spoken to regarding all imaging and laboratory results and appropriate follow up for these results. All education provided in verbal and written form and time  was allowed for answering of patient questions. Patient discharged.          Emergency Department Medication Summary:   Medications  oxyCODONE-acetaminophen (PERCOCET/ROXICET) 5-325 MG per tablet 1 tablet (has no administration in time range)  ketorolac (TORADOL) 15 MG/ML injection 15 mg (has no administration in time range)     Clinical Impression:  1. Acute low back pain, unspecified back pain laterality, unspecified whether sciatica present      Discharge   Final Clinical Impression(s) / ED Diagnoses Final diagnoses:  Acute low back pain, unspecified back pain laterality, unspecified whether sciatica present    Rx / DC Orders ED Discharge Orders          Ordered    methocarbamol (ROBAXIN) 500 MG tablet  2 times daily        12/01/21 2257              Glyn Ade, MD 12/01/21 2301

## 2021-12-06 ENCOUNTER — Emergency Department (HOSPITAL_BASED_OUTPATIENT_CLINIC_OR_DEPARTMENT_OTHER)
Admission: EM | Admit: 2021-12-06 | Discharge: 2021-12-06 | Disposition: A | Discharge status: Home / Self Care | Payer: BC Managed Care – PPO | Attending: Emergency Medicine | Admitting: Emergency Medicine

## 2021-12-06 ENCOUNTER — Emergency Department (HOSPITAL_BASED_OUTPATIENT_CLINIC_OR_DEPARTMENT_OTHER): Payer: BC Managed Care – PPO

## 2021-12-06 ENCOUNTER — Encounter (HOSPITAL_BASED_OUTPATIENT_CLINIC_OR_DEPARTMENT_OTHER): Payer: Self-pay | Admitting: Emergency Medicine

## 2021-12-06 ENCOUNTER — Other Ambulatory Visit: Payer: Self-pay

## 2021-12-06 DIAGNOSIS — R1031 Right lower quadrant pain: Secondary | ICD-10-CM | POA: Diagnosis present

## 2021-12-06 DIAGNOSIS — M5441 Lumbago with sciatica, right side: Secondary | ICD-10-CM | POA: Insufficient documentation

## 2021-12-06 DIAGNOSIS — Z79899 Other long term (current) drug therapy: Secondary | ICD-10-CM | POA: Insufficient documentation

## 2021-12-06 LAB — CBC
HCT: 40.2 % (ref 36.0–46.0)
Hemoglobin: 12.4 g/dL (ref 12.0–15.0)
MCH: 23.7 pg — ABNORMAL LOW (ref 26.0–34.0)
MCHC: 30.8 g/dL (ref 30.0–36.0)
MCV: 76.9 fL — ABNORMAL LOW (ref 80.0–100.0)
Platelets: 269 10*3/uL (ref 150–400)
RBC: 5.23 MIL/uL — ABNORMAL HIGH (ref 3.87–5.11)
RDW: 17 % — ABNORMAL HIGH (ref 11.5–15.5)
WBC: 6.2 10*3/uL (ref 4.0–10.5)
nRBC: 0 % (ref 0.0–0.2)

## 2021-12-06 LAB — BASIC METABOLIC PANEL
Anion gap: 7 (ref 5–15)
BUN: 10 mg/dL (ref 6–20)
CO2: 27 mmol/L (ref 22–32)
Calcium: 9.2 mg/dL (ref 8.9–10.3)
Chloride: 106 mmol/L (ref 98–111)
Creatinine, Ser: 0.98 mg/dL (ref 0.44–1.00)
GFR, Estimated: >60 mL/min (ref >60)
Glucose, Bld: 130 mg/dL — ABNORMAL HIGH (ref 70–99)
Potassium: 3.4 mmol/L — ABNORMAL LOW (ref 3.5–5.1)
Sodium: 140 mmol/L (ref 135–145)

## 2021-12-06 LAB — TROPONIN I (HIGH SENSITIVITY): Troponin I (High Sensitivity): <2 ng/L (ref <18)

## 2021-12-06 MED ORDER — IOHEXOL 300 MG/ML  SOLN
100.0000 mL | Freq: Once | INTRAMUSCULAR | Status: AC | PRN
  Administered 2021-12-06: 100 mL via INTRAVENOUS

## 2021-12-06 MED ORDER — LIDOCAINE 5 % EX PTCH: 1.0000 | MEDICATED_PATCH | CUTANEOUS | 0 refills | Status: AC

## 2021-12-06 MED ORDER — OXYCODONE-ACETAMINOPHEN 5-325 MG PO TABS: 1.0000 | ORAL_TABLET | ORAL | 0 refills | Status: AC | PRN

## 2021-12-06 MED ORDER — MORPHINE SULFATE (PF) 4 MG/ML IV SOLN
4.0000 mg | Freq: Once | INTRAVENOUS | Status: AC
  Administered 2021-12-06: 4 mg via INTRAVENOUS
  Filled 2021-12-06: qty 1

## 2021-12-06 MED ORDER — CYCLOBENZAPRINE HCL 10 MG PO TABS: 10.0000 mg | ORAL_TABLET | Freq: Two times a day (BID) | ORAL | 0 refills | Status: AC | PRN

## 2021-12-06 NOTE — ED Provider Notes (Signed)
[facility] EMERGENCY DEPARTMENT Provider Note   CSN: [id number] Arrival date & time: 12/06/21  1813     History  Chief Complaint  Patient presents with   Pain    Anna Koch is a 31 y.o. female.  The history is provided by the patient and medical records. No language interpreter was used.  Abdominal Pain Pain location:  RLQ Pain quality: aching   Pain radiates to:  Does not radiate Pain severity:  Severe Onset quality:  Gradual Duration:  1 week Timing:  Constant Progression:  Waxing and waning Chronicity:  New Context: previous surgery   Relieved by:  Nothing Worsened by:  Palpation and movement Ineffective treatments:  None tried Associated symptoms: no chest pain, no chills, no constipation, no cough, no diarrhea, no dysuria, no fatigue, no fever, no hematuria, no nausea, no shortness of breath and no vomiting        Home Medications Prior to Admission medications   Medication Sig Start Date End Date Taking? Authorizing Provider  albuterol (VENTOLIN HFA) 108 (90 Base) MCG/ACT inhaler Inhale into the lungs. 06/10/19   [provider]  amLODipine (NORVASC) 10 MG tablet Take 1 tablet by mouth daily. 04/23/19   [provider]  benzonatate (TESSALON) 100 MG capsule Take 1 capsule (100 mg total) by mouth every 8 (eight) hours. 03/26/20   Henderly, Britni A, PA-C  cyclobenzaprine (FLEXERIL) 10 MG tablet Take 1 tablet (10 mg total) by mouth 2 (two) times daily as needed for muscle spasms. 07/15/20   Quintella Reichert, MD  ferrous sulfate 325 (65 FE) MG tablet Take by mouth. 05/03/19   [provider]  fluticasone (FLONASE) 50 MCG/ACT nasal spray Place into the nose. 12/26/19 12/25/20  [provider]  HYDROcodone-acetaminophen (NORCO/VICODIN) 5-325 MG tablet Take 1 tablet by mouth every 4 (four) hours as needed. 01/15/21   Jeanell Sparrow, DO  levonorgestrel (MIRENA) 20 MCG/DAY IUD by Intrauterine route. 10/11/13   [provider]  methocarbamol (ROBAXIN) 500 MG tablet Take 1 tablet (500 mg total) by mouth 2 (two) times daily. 12/01/21   Tretha Sciara, MD  Norethin Ace-Eth Estrad-FE 1-20 MG-MCG(24) CHEW Chew by mouth. 09/24/18   [provider]  norethindrone (MICRONOR) 0.35 MG tablet Take 1 tablet by mouth daily. 09/26/19   [provider]  ondansetron (ZOFRAN ODT) 4 MG disintegrating tablet Take 1 tablet (4 mg total) by mouth every 8 (eight) hours as needed for nausea or vomiting. 03/26/20   Henderly, Britni A, PA-C      Allergies    Penicillins    Review of Systems   Review of Systems  Constitutional:  Negative for chills, fatigue and fever.  HENT:  Negative for congestion.   Respiratory:  Negative for cough, chest tightness and shortness of breath.   Cardiovascular:  Negative for chest pain, palpitations and leg swelling.  Gastrointestinal:  Positive for abdominal pain. Negative for abdominal distention, constipation, diarrhea, nausea and vomiting.  Genitourinary:  Positive for flank pain. Negative for dysuria, frequency and hematuria.  Musculoskeletal:  Positive for back pain. Negative for neck pain.  Skin:  Negative for rash and wound.  Neurological:  Negative for weakness, light-headedness, numbness and headaches.  Psychiatric/Behavioral:  Negative for agitation.   All other systems reviewed and are negative.   Physical Exam Updated Vital Signs BP (!) 133/93 (BP Location: Right Arm)   Pulse 88   Temp 98 F (36.7 C) (Oral)   Resp 15   Ht 5\' 7"  (  1.702 m)   Wt 108.9 kg   LMP 01/13/2021 (Exact Date)   SpO2 100%   BMI 37.59 kg/m  Physical Exam Vitals and nursing note reviewed.  Constitutional:      General: She is not in acute distress.    Appearance: She is well-developed. She is not ill-appearing, toxic-appearing or diaphoretic.  HENT:     Head: Normocephalic and atraumatic.     Nose: No congestion or rhinorrhea.     Mouth/Throat:     Mouth: Mucous membranes  are moist.     Pharynx: No oropharyngeal exudate or posterior oropharyngeal erythema.  Eyes:     Extraocular Movements: Extraocular movements intact.     Conjunctiva/sclera: Conjunctivae normal.     Pupils: Pupils are equal, round, and reactive to light.  Cardiovascular:     Rate and Rhythm: Normal rate and regular rhythm.     Heart sounds: No murmur heard. Pulmonary:     Effort: Pulmonary effort is normal. No respiratory distress.     Breath sounds: Normal breath sounds. No wheezing, rhonchi or rales.  Chest:     Chest wall: Tenderness present.  Abdominal:     General: Abdomen is flat.     Palpations: Abdomen is soft.     Tenderness: There is abdominal tenderness in the right lower quadrant. There is right CVA tenderness. There is no left CVA tenderness, guarding or rebound.    Musculoskeletal:        General: Tenderness present. No swelling.     Cervical back: Neck supple. No tenderness.     Lumbar back: Tenderness present. No signs of trauma. Positive right straight leg raise test. Negative left straight leg raise test.       Back:     Right lower leg: No edema.     Left lower leg: No edema.  Skin:    General: Skin is warm and dry.     Capillary Refill: Capillary refill takes less than 2 seconds.     Findings: No erythema or rash.  Neurological:     General: No focal deficit present.     Mental Status: She is alert.     Sensory: No sensory deficit.     Motor: No weakness.     Coordination: Coordination normal.  Psychiatric:        Mood and Affect: Mood normal.     ED Results / Procedures / Treatments   Labs (all labs ordered are listed, but only abnormal results are displayed) Labs Reviewed  BASIC METABOLIC PANEL - Abnormal; Notable for the following components:      Result Value   Potassium 3.4 (*)    Glucose, Bld 130 (*)    All other components within normal limits  CBC - Abnormal; Notable for the following components:   RBC 5.23 (*)    MCV 76.9 (*)    MCH  23.7 (*)    RDW 17.0 (*)    All other components within normal limits  TROPONIN I (HIGH SENSITIVITY)    EKG EKG Interpretation  Date/Time:  Monday December 06 2021 18:22:13 EDT Ventricular Rate:  89 PR Interval:  126 QRS Duration: 78 QT Interval:  352 QTC Calculation: 428 R Axis:   63 Text Interpretation: Normal sinus rhythm Right atrial enlargement Nonspecific ST and T wave abnormality Abnormal ECG No previous ECGs available no prior ECG for comparison No STEMI Confirmed by Theda Belfast ([id number]) on 12/06/2021 8:28:55 PM  Radiology CT ABDOMEN PELVIS W CONTRAST  Result  Date: 12/06/2021 CLINICAL DATA:  Right lower quadrant abdominal pain. Pain in right back going down legs. EXAM: CT ABDOMEN AND PELVIS WITH CONTRAST TECHNIQUE: Multidetector CT imaging of the abdomen and pelvis was performed using the standard protocol following bolus administration of intravenous contrast. RADIATION DOSE REDUCTION: This exam was performed according to the departmental dose-optimization program which includes automated exposure control, adjustment of the mA and/or kV according to patient size and/or use of iterative reconstruction technique. CONTRAST:  122mL OMNIPAQUE IOHEXOL 300 MG/ML  SOLN COMPARISON:  01/04/2021. FINDINGS: Lower chest: No acute abnormality. Hepatobiliary: 6 mm hypodensity is present in the left lobe of the liver which is too small to further characterize. Fatty infiltration of the liver is noted. No biliary ductal dilatation. The gallbladder is without stones. Pancreas: Unremarkable. No pancreatic ductal dilatation or surrounding inflammatory changes. Spleen: Normal in size without focal abnormality. Adrenals/Urinary Tract: The adrenal glands are within normal limits. The kidneys enhance symmetrically. No renal calculus or hydronephrosis. The bladder is unremarkable. Stomach/Bowel: Stomach is within normal limits. Appendix appears normal. No evidence of bowel wall thickening, distention, or  inflammatory changes. No free air or pneumatosis. A few scattered diverticula are present along the colon without evidence of diverticulitis. Vascular/Lymphatic: No significant vascular findings are present. No enlarged abdominal or pelvic lymph nodes. Reproductive: The uterus is surgically absent.  No adnexal mass. Other: Small fat containing umbilical hernia.  No ascites. 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 No acute osseous abnormality. IMPRESSION: 1. No acute intra-abdominal process. 2. Hepatic steatosis. 3. Diverticulosis without diverticulitis. Electronically Signed   By: Brett Fairy M.D.   On: 12/06/2021 21:39   DG Chest 2 View  Result Date: 12/06/2021 CLINICAL DATA:  Chest pain EXAM: CHEST - 2 VIEW COMPARISON:  06/19/2017 FINDINGS: The heart size and mediastinal contours are within normal limits. Both lungs are clear. The visualized skeletal structures are unremarkable. IMPRESSION: No active cardiopulmonary disease. Electronically Signed   By: Jerilynn Mages.  Shick M.D.   On: 12/06/2021 18:45    Procedures Procedures    Medications Ordered in ED Medications  morphine (PF) 4 MG/ML injection 4 mg (4 mg Intravenous Given 12/06/21 2108)  iohexol (OMNIPAQUE) 300 MG/ML solution 100 mL (100 mLs Intravenous Contrast Given 12/06/21 2117)    ED Course/ Medical Decision Making/ A&P                           Medical Decision Making Amount and/or Complexity of Data Reviewed Labs: ordered. Radiology: ordered.  Risk Prescription drug management.    Anna Koch is a 31 y.o. female with a past medical history significant for previous hysterectomy, ovarian cyst, and chronic extremity symptoms of pain and numbness who presents with right lower quadrant abdominal pain going towards her back and down her legs.  According to patient, she has chronic symptoms of numbness and pain in her arms and chronic pains in her legs that she is being worked up at [facility] with neurology about and she says that she is in discussions with getting an outpatient MRI or CT to further evaluate her back.  She denies new trauma reports that for the last week she has had pain that has been worsening in her right lower quadrant.  She reports no nausea or vomiting and no other GI or GU symptoms.  Denies any vaginal symptoms.  She reports that the pain does not go straight through to her back  but she feels in her back and it goes down her right leg more than left.  She denies any new numbness, tingling, or weakness in the legs and denies loss of bowel or bladder control.  Denies any fevers, chills, and there is no documented history of IV drug use on recent evaluation.  She said that she has had no cough or congestion but has had some chest discomfort today.  Feels like a slight pressure and tenderness in her central chest.  Denies other complaints on arrival.  On exam, lungs clear and chest was tender to palpation reproducing her discomfort.  She does have a murmur.  Good pulses in extremities.  Left leg had negative straight leg raise but right leg was positive with pain going down her leg.  Intact sensation and strength distally.  Good pulses.  Right lower quadrant is quite tender to palpation as is her right low back and right flank.  No rash to suggest shingles.  Midline of her low back was minimally tender.  Had a shared decision made conversation with patient.  She reports that she does have an appointment already with PCP tomorrow but with the amount of pain she wanted to get evaluated.  She is agreeable to getting a CT abdomen pelvis to rule out acute appendicitis given location of discomfort and also make sure there is no large change in her back.  We agreed that given her lack of new numbness, weakness, or any bowel or bladder incontinence we do not feel she needs emergent transfer for MRI at this time.  We will get the CT images and give her some pain medicine.  We will  get chest x-ray and troponins for her chest discomfort.  Chest x-ray returned reassuring.  EKG does not show acute STEMI.  Initial troponin is negative, will trend.  Patient's labs otherwise show mild hypokalemia of 3.4 but otherwise had reassuring electrolytes and normal kidney function.  CBC shows no leukocytosis or anemia.  Normal platelets.  Anticipate follow-up on the results and her CT scan and if they are reassuring, dissipate escalation of her pain medicine and follow-up with her doctor tomorrow for outpatient follow-up.  Patient was feeling better after medications.  CT scan showed no appendicitis and no other acute findings.  We went through all the findings together.  Clinically I suspect musculoskeletal chest pain given the tenderness on exam and the reassuring work-up otherwise.  Your CT scan also did not show acute fracture in the spine and show the small pars defect we discussed.  She is going to follow-up with her primary doctor tomorrow to discuss further outpatient work-up.  We will increase her pain regimen temporarily and give her a different muscle relaxant and Lidoderm patches.  Patient agrees with this plan.  She understands return precautions and follow-up instructions and was discharged in good condition with improvement in symptoms and otherwise well appearance.         Final Clinical Impression(s) / ED Diagnoses Final diagnoses:  RLQ abdominal pain  Acute right-sided low back pain with right-sided sciatica    Rx / DC Orders ED Discharge Orders          Ordered    oxyCODONE-acetaminophen (PERCOCET/ROXICET) 5-325 MG tablet  Every 4 hours PRN        12/06/21 2254    cyclobenzaprine (FLEXERIL) 10 MG tablet  2 times daily PRN        12/06/21 2254    lidocaine (LIDODERM) 5 %  Every 24 hours        12/06/21 2254            Clinical Impression: 1. RLQ abdominal pain   2. Acute right-sided low back pain with right-sided sciatica     Disposition:  Discharge  Condition: Good  I have discussed the results, Dx and Tx plan with the pt(& family if present). He/she/they expressed understanding and agree(s) with the plan. Discharge instructions discussed at great length. Strict return precautions discussed and pt &/or family have verbalized understanding of the instructions. No further questions at time of discharge.    New Prescriptions   CYCLOBENZAPRINE (FLEXERIL) 10 MG TABLET    Take 1 tablet (10 mg total) by mouth 2 (two) times daily as needed for muscle spasms.   LIDOCAINE (LIDODERM) 5 %    Place 1 patch onto the skin daily. Remove & Discard patch within 12 hours or as directed by MD   OXYCODONE-ACETAMINOPHEN (PERCOCET/ROXICET) 5-325 MG TABLET    Take 1 tablet by mouth every 4 (four) hours as needed for severe pain.    Follow Up: Inc, [facility] [address] [place] [address] [phone]     your back doctor     [facility] [address] [id number] QQ [place]N [place] [address] [phone]       Niaya Hickok, Canary Brim, MD 12/06/21 [phone]

## 2021-12-06 NOTE — Discharge Instructions (Signed)
Your history, exam, evaluation today ruled out appendicitis as the cause of your right lower quadrant abdominal pain.  I suspect your back pain is related to some of your chronic troubles causing the leg symptoms.  Given your lack of other red flags including the persistent numbness, weakness, or loss of bowel or bladder control, we had a shared decision-making conversation and feel you are safe for discharge to see your primary doctor tomorrow as opposed to transfer for MRI tonight.  Your cardiac enzyme was negative and chest x-ray was also reassuring.  Suspect musculoskeletal chest wall discomfort given the tenderness on exam as well.  Please use the pain medicine, switch to the different muscle relaxant, and use the patches to help with symptoms.  If any symptoms change or worsen acutely, please return to the nearest emergency department.

## 2021-12-06 NOTE — ED Triage Notes (Signed)
Lower back pain BL leg pain x 1 week.  Chest pain started yesterday.  States leg pain is chronic and has been going on for "awhile" States she has been working with her PCP for diagnosis.  Seen 9/6 for similar sx.

## 2022-07-11 ENCOUNTER — Encounter: Payer: Self-pay | Admitting: *Deleted

## 2022-08-09 ENCOUNTER — Emergency Department (HOSPITAL_BASED_OUTPATIENT_CLINIC_OR_DEPARTMENT_OTHER)
Admission: EM | Admit: 2022-08-09 | Discharge: 2022-08-09 | Disposition: A | Discharge status: Home / Self Care | Payer: No Typology Code available for payment source | Attending: Emergency Medicine | Admitting: Emergency Medicine

## 2022-08-09 ENCOUNTER — Encounter (HOSPITAL_BASED_OUTPATIENT_CLINIC_OR_DEPARTMENT_OTHER): Payer: Self-pay

## 2022-08-09 ENCOUNTER — Other Ambulatory Visit: Payer: Self-pay

## 2022-08-09 DIAGNOSIS — M5442 Lumbago with sciatica, left side: Secondary | ICD-10-CM | POA: Insufficient documentation

## 2022-08-09 DIAGNOSIS — M5441 Lumbago with sciatica, right side: Secondary | ICD-10-CM | POA: Diagnosis not present

## 2022-08-09 DIAGNOSIS — M545 Low back pain, unspecified: Secondary | ICD-10-CM | POA: Diagnosis present

## 2022-08-09 MED ORDER — KETOROLAC TROMETHAMINE 60 MG/2ML IM SOLN
60.0000 mg | Freq: Once | INTRAMUSCULAR | Status: AC
  Administered 2022-08-09: 60 mg via INTRAMUSCULAR
  Filled 2022-08-09: qty 2

## 2022-08-09 MED ORDER — PREDNISONE 10 MG PO TABS: 30.0000 mg | ORAL_TABLET | Freq: Every day | ORAL | 0 refills | Status: AC

## 2022-08-09 MED ORDER — OXYCODONE HCL 5 MG PO TABS
5.0000 mg | ORAL_TABLET | Freq: Once | ORAL | Status: AC
  Administered 2022-08-09: 5 mg via ORAL
  Filled 2022-08-09: qty 1

## 2022-08-09 MED ORDER — METHOCARBAMOL 500 MG PO TABS: 500.0000 mg | ORAL_TABLET | Freq: Two times a day (BID) | ORAL | 0 refills | Status: AC

## 2022-08-09 NOTE — ED Triage Notes (Signed)
Patient presents to ED via POV from home. Here with neck, back, hip and leg pain. Pain is bilaterally. Reports pain has been present for a year. Currently seeing a specialist for same. Denies recent injury or trauma.

## 2022-08-09 NOTE — ED Notes (Signed)
Pt care taken, is complaining of the rt leg and back hurting, I am giving her the pain medication and will reassess in 30 min.

## 2022-08-09 NOTE — ED Provider Notes (Signed)
[place] [facility] Provider Note   CSN: [id number] Arrival date & time: 08/09/22  1744     History  Chief Complaint  Patient presents with   Back Pain   Leg Pain    Anna Koch is a 32 y.o. female.  HPI   Patient with medical history including fibroids, abdominal hysterectomy, chronic lower back pain presenting with back pain, states she been doing this for the last year bed starting today she had an exacerbation of her back pain.  Patient is that she was getting up from her chair and felt pain from her back going down to both of the legs bilaterally, incision goes past her knees, she states the are hurting, no saddle paresthesias no urinary or bowel incontinency's, denies any urinary symptoms, no suprapubic pain or flank tenderness, no recent trauma.  Patient states that she has had pain like this before but feels slightly worse than usual, patient has no history of AAA, dissection, family history of connective tissue orders were Murfin disease.  Patient states symptoms improved with rest worsened with movement.   I have reviewed patient's chart being followed by neurology, had MRI lumbar thoracic spine, shows small disc protrusion at L4-L5 without stenosis.  Home Medications Prior to Admission medications   Medication Sig Start Date End Date Taking? Authorizing Provider  methocarbamol (ROBAXIN) 500 MG tablet Take 1 tablet (500 mg total) by mouth 2 (two) times daily. 08/09/22  Yes Carroll Sage, PA-C  predniSONE (DELTASONE) 10 MG tablet Take 3 tablets (30 mg total) by mouth daily. 08/09/22  Yes Carroll Sage, PA-C  albuterol (VENTOLIN HFA) 108 (90 Base) MCG/ACT inhaler Inhale into the lungs. 06/10/19   [provider]  amLODipine (NORVASC) 10 MG tablet Take 1 tablet by mouth daily. 04/23/19   [provider]  benzonatate (TESSALON) 100 MG capsule Take 1 capsule (100 mg total) by mouth every 8 (eight) hours.  03/26/20   Henderly, Britni A, PA-C  cyclobenzaprine (FLEXERIL) 10 MG tablet Take 1 tablet (10 mg total) by mouth 2 (two) times daily as needed for muscle spasms. 07/15/20   Tilden Fossa, MD  cyclobenzaprine (FLEXERIL) 10 MG tablet Take 1 tablet (10 mg total) by mouth 2 (two) times daily as needed for muscle spasms. 12/06/21   Tegeler, Canary Brim, MD  ferrous sulfate 325 (65 FE) MG tablet Take by mouth. 05/03/19   [provider]  fluticasone (FLONASE) 50 MCG/ACT nasal spray Place into the nose. 12/26/19 12/25/20  [provider]  HYDROcodone-acetaminophen (NORCO/VICODIN) 5-325 MG tablet Take 1 tablet by mouth every 4 (four) hours as needed. 01/15/21   Sloan Leiter, DO  levonorgestrel (MIRENA) 20 MCG/DAY IUD by Intrauterine route. 10/11/13   [provider]  lidocaine (LIDODERM) 5 % Place 1 patch onto the skin daily. Remove & Discard patch within 12 hours or as directed by MD 12/06/21   Tegeler, Canary Brim, MD  Norethin Ace-Eth Estrad-FE 1-20 MG-MCG(24) CHEW Chew by mouth. 09/24/18   [provider]  norethindrone (MICRONOR) 0.35 MG tablet Take 1 tablet by mouth daily. 09/26/19   [provider]  ondansetron (ZOFRAN ODT) 4 MG disintegrating tablet Take 1 tablet (4 mg total) by mouth every 8 (eight) hours as needed for nausea or vomiting. 03/26/20   Henderly, Britni A, PA-C  oxyCODONE-acetaminophen (PERCOCET/ROXICET) 5-325 MG tablet Take 1 tablet by mouth every 4 (four) hours as needed for severe pain. 12/06/21   Tegeler, Canary Brim, MD  Allergies    Penicillins    Review of Systems   Review of Systems  Constitutional:  Negative for chills and fever.  Respiratory:  Negative for shortness of breath.   Cardiovascular:  Negative for chest pain.  Gastrointestinal:  Negative for abdominal pain.  Musculoskeletal:  Positive for back pain.  Neurological:  Negative for headaches.    Physical Exam Updated Vital Signs BP 138/82 (BP Location: Right  Arm)   Pulse 74   Temp 98.1 F (36.7 C) (Oral)   Resp 18   Ht 5\' 6"  (1.676 m)   Wt 112.5 kg   LMP 01/13/2021 (Exact Date)   SpO2 98%   BMI 40.03 kg/m  Physical Exam Vitals and nursing note reviewed.  Constitutional:      General: She is not in acute distress.    Appearance: She is not ill-appearing.  HENT:     Head: Normocephalic and atraumatic.     Nose: No congestion.  Eyes:     Conjunctiva/sclera: Conjunctivae normal.  Cardiovascular:     Rate and Rhythm: Normal rate and regular rhythm.     Pulses: Normal pulses.     Heart sounds: No murmur heard.    No friction rub. No gallop.  Pulmonary:     Effort: No respiratory distress.     Breath sounds: No wheezing, rhonchi or rales.  Abdominal:     Comments: Abdomen is soft nontender no suprapubic pain.  Musculoskeletal:     Comments: Spine was palpated was nontender to palpation no step-off deformities noted, patient had focalized pain in her lower back mainly within the musculature surrounding the paraspinal muscles of the lumbar spine, patient has 5-5 strength in the lower extremities, sensation tact light touch, 2+ dorsal pedal pulses, 2-second capillary refill, 2+ patellar reflexes 2+ Achilles tendon reflexes.  Skin:    General: Skin is warm and dry.  Neurological:     Mental Status: She is alert.  Psychiatric:        Mood and Affect: Mood normal.     ED Results / Procedures / Treatments   Labs (all labs ordered are listed, but only abnormal results are displayed) Labs Reviewed - No data to display  EKG None  Radiology No results found.  Procedures Procedures    Medications Ordered in ED Medications  oxyCODONE (Oxy IR/ROXICODONE) immediate release tablet 5 mg (5 mg Oral Given 08/09/22 2120)  ketorolac (TORADOL) injection 60 mg (60 mg Intramuscular Given 08/09/22 2120)    ED Course/ Medical Decision Making/ A&P                             Medical Decision Making Risk Prescription drug  management.   This patient presents to the ED for concern of back pain, this involves an extensive number of treatment options, and is a complaint that carries with it a high risk of complications and morbidity.  The differential diagnosis includes AAA, dissection, spinal equina, UTI, pyelo-,    Additional history obtained:  Additional history obtained from N/A External records from outside source obtained and reviewed including neurology notes   Co morbidities that complicate the patient evaluation  Chronic back pain  Social Determinants of Health:  N/A    Lab Tests:  I Ordered, and personally interpreted labs.  The pertinent results include: N/A   Imaging Studies ordered:  I ordered imaging studies including N/A I independently visualized and interpreted imaging which showed N/A I agree  with the radiologist interpretation   Cardiac Monitoring:  The patient was maintained on a cardiac monitor.  I personally viewed and interpreted the cardiac monitored which showed an underlying rhythm of: N/A   Medicines ordered and prescription drug management:  I ordered medication including oxycodone, Toradol I have reviewed the patients home medicines and have made adjustments as needed  Critical Interventions:  N/A   Reevaluation:  Presents with back pain, patient benign physical exam, agreement discharge at this time.  Consultations Obtained:  N/a    Test Considered:  CT lumbar spine-defers as my suspicion for fracture is very low at this time, not increased risk for pathological fractures, no traumatic injury associated this pain.    Rule out I have low suspicion for spinal fracture or spinal cord abnormality as patient denies urinary incontinency, retention, difficulty with bowel movements, denies saddle paresthesias.  Spine was palpated there is no step-off, crepitus or gross deformities felt, patient had 5/5 strength, full range of motion, neurovascular  fully intact in the lower extremities.  I doubt AAA or dissection presentation atypical she has low risk factors pain is focalized and reproducible.  I doubt UTI Pilo kidney stone nonnursing any urinary symptoms no flank tenderness.. . Low suspicion epidural abscess is no evidence of infection present my exam.     Dispostion and problem list  After consideration of the diagnostic results and the patients response to treatment, I feel that the patent would benefit from discharge.  Back pain-likely acute on chronic pain, will start a short course of steroids, provide muscle relaxer, encouraged patient to go to orthopedic appointment next week.            Final Clinical Impression(s) / ED Diagnoses Final diagnoses:  Acute bilateral low back pain with bilateral sciatica    Rx / DC Orders ED Discharge Orders          Ordered    predniSONE (DELTASONE) 10 MG tablet  Daily        08/09/22 2131    methocarbamol (ROBAXIN) 500 MG tablet  2 times daily        08/09/22 2131              Carroll Sage, PA-C 08/09/22 2132    Rolan Bucco, MD 08/10/22 1341

## 2022-08-09 NOTE — ED Notes (Signed)
Pt c/o low back and BLE pain after standing up for a chair at work.  Pt reports having this pain intermittently x1 year.  Pt has been seen by a Neurologist for same and has had testing and prescribed Lyrica.  Pt sts she has been referred to Ortho because Neuro no longer thinks it's nerve related.  Also, pt reports "I was told to come to the ED when I have a flare-up."    Pt reports lower neck pain is new today.

## 2022-08-09 NOTE — ED Notes (Signed)
Pt A&OX4 ambulatory at d/c with independent steady gait. Pt verbalized understanding of d/c instructions and follow up care. 

## 2022-08-09 NOTE — Discharge Instructions (Signed)
You have been seen here for back pain. I recommend taking over-the-counter pain medications like ibuprofen and/or Tylenol every 6 as needed.  Please follow dosage and on the back of bottle.  I also recommend applying heat to the area and stretching out the muscles as this will help decrease stiffness and pain.  I have given you information on exercises please follow.    I have also given you a prescription for a muscle relaxer this can make you drowsy do not consume alcohol or operate heavy machinery when taking this medication.   Please follow-up with orthopedic for further evaluation.  Come back to the emergency department if you develop chest pain, shortness of breath, severe abdominal pain, uncontrolled nausea, vomiting, diarrhea.

## 2022-08-12 ENCOUNTER — Encounter (HOSPITAL_BASED_OUTPATIENT_CLINIC_OR_DEPARTMENT_OTHER): Payer: Self-pay

## 2022-08-12 ENCOUNTER — Other Ambulatory Visit: Payer: Self-pay

## 2022-08-12 ENCOUNTER — Emergency Department (HOSPITAL_BASED_OUTPATIENT_CLINIC_OR_DEPARTMENT_OTHER)
Admission: EM | Admit: 2022-08-12 | Discharge: 2022-08-12 | Disposition: A | Discharge status: Home / Self Care | Payer: No Typology Code available for payment source | Attending: Emergency Medicine | Admitting: Emergency Medicine

## 2022-08-12 DIAGNOSIS — M7918 Myalgia, other site: Secondary | ICD-10-CM | POA: Insufficient documentation

## 2022-08-12 DIAGNOSIS — M791 Myalgia, unspecified site: Secondary | ICD-10-CM

## 2022-08-12 DIAGNOSIS — Z79899 Other long term (current) drug therapy: Secondary | ICD-10-CM | POA: Insufficient documentation

## 2022-08-12 DIAGNOSIS — M25511 Pain in right shoulder: Secondary | ICD-10-CM | POA: Diagnosis present

## 2022-08-12 MED ORDER — HYDROCODONE-ACETAMINOPHEN 5-325 MG PO TABS
2.0000 | ORAL_TABLET | Freq: Once | ORAL | Status: AC
  Administered 2022-08-12: 2 via ORAL
  Filled 2022-08-12: qty 2

## 2022-08-12 MED ORDER — METHYLPREDNISOLONE SODIUM SUCC 125 MG IJ SOLR
125.0000 mg | Freq: Once | INTRAMUSCULAR | Status: AC
  Administered 2022-08-12: 125 mg via INTRAMUSCULAR
  Filled 2022-08-12: qty 2

## 2022-08-12 MED ORDER — HYDROCODONE-ACETAMINOPHEN 5-325 MG PO TABS: 1.0000 | ORAL_TABLET | ORAL | 0 refills | Status: AC | PRN

## 2022-08-12 NOTE — ED Notes (Signed)
Discharge instructions reviewed with patient. Patient questions answered and opportunity for education reviewed. Patient voices understanding of discharge instructions with no further questions. Patient ambulatory with steady gait to lobby.  

## 2022-08-12 NOTE — ED Triage Notes (Signed)
Patient having body aches and joint pain

## 2022-08-12 NOTE — Discharge Instructions (Addendum)
-  Continue prednisone 

## 2022-08-12 NOTE — ED Provider Notes (Signed)
[place] [facility] Provider Note   CSN: [id number] Arrival date & time: 08/12/22  1751     History  Chief Complaint  Patient presents with   Muscle Pain    Anna Koch is a 32 y.o. female.  Patient complains of bodyaches.  Patient reports that she has pain in both shoulders, pain in her low back pain in both legs.  Patient reports she has had multiple evaluations for the same.  Patient reports that she has been seen by her primary care physician and neurology and rheumatology.  Patient reports that she was seen here 2 days ago and started on prednisone patient reports she was feeling better yesterday but began having increasing pain again today.  She reports that her neurologist has advised her to follow-up with orthopedics.  Patient states that she has had the symptoms for over a year.  Patient denies any fever or chills.  Patient denies any injuries.  The history is provided by the patient. No language interpreter was used.  Muscle Pain This is a recurrent problem. The current episode started more than 2 days ago. The problem occurs constantly.       Home Medications Prior to Admission medications   Medication Sig Start Date End Date Taking? Authorizing Provider  HYDROcodone-acetaminophen (NORCO/VICODIN) 5-325 MG tablet Take 1 tablet by mouth every 4 (four) hours as needed for moderate pain. 08/12/22 08/12/23 Yes Cheron Schaumann K, PA-C  albuterol (VENTOLIN HFA) 108 (90 Base) MCG/ACT inhaler Inhale into the lungs. 06/10/19   [provider]  amLODipine (NORVASC) 10 MG tablet Take 1 tablet by mouth daily. 04/23/19   [provider]  benzonatate (TESSALON) 100 MG capsule Take 1 capsule (100 mg total) by mouth every 8 (eight) hours. 03/26/20   Henderly, Britni A, PA-C  cyclobenzaprine (FLEXERIL) 10 MG tablet Take 1 tablet (10 mg total) by mouth 2 (two) times daily as needed for muscle spasms. 07/15/20   Tilden Fossa, MD   cyclobenzaprine (FLEXERIL) 10 MG tablet Take 1 tablet (10 mg total) by mouth 2 (two) times daily as needed for muscle spasms. 12/06/21   Tegeler, Canary Brim, MD  ferrous sulfate 325 (65 FE) MG tablet Take by mouth. 05/03/19   [provider]  fluticasone (FLONASE) 50 MCG/ACT nasal spray Place into the nose. 12/26/19 12/25/20  [provider]  levonorgestrel (MIRENA) 20 MCG/DAY IUD by Intrauterine route. 10/11/13   [provider]  lidocaine (LIDODERM) 5 % Place 1 patch onto the skin daily. Remove & Discard patch within 12 hours or as directed by MD 12/06/21   Tegeler, Canary Brim, MD  methocarbamol (ROBAXIN) 500 MG tablet Take 1 tablet (500 mg total) by mouth 2 (two) times daily. 08/09/22   Carroll Sage, PA-C  Norethin Ace-Eth Estrad-FE 1-20 MG-MCG(24) CHEW Chew by mouth. 09/24/18   [provider]  norethindrone (MICRONOR) 0.35 MG tablet Take 1 tablet by mouth daily. 09/26/19   [provider]  ondansetron (ZOFRAN ODT) 4 MG disintegrating tablet Take 1 tablet (4 mg total) by mouth every 8 (eight) hours as needed for nausea or vomiting. 03/26/20   Henderly, Britni A, PA-C  oxyCODONE-acetaminophen (PERCOCET/ROXICET) 5-325 MG tablet Take 1 tablet by mouth every 4 (four) hours as needed for severe pain. 12/06/21   Tegeler, Canary Brim, MD  predniSONE (DELTASONE) 10 MG tablet Take 3 tablets (30 mg total) by mouth daily. 08/09/22   Carroll Sage, PA-C      Allergies  Penicillins    Review of Systems   Review of Systems  Musculoskeletal:  Positive for myalgias.  All other systems reviewed and are negative.   Physical Exam Updated Vital Signs BP 138/84   Pulse 70   Temp 98.4 F (36.9 C) (Oral)   Resp 18   Ht 5\' 7"  (1.702 m)   Wt 112.5 kg   LMP 01/13/2021 (Exact Date)   SpO2 97%   BMI 38.84 kg/m  Physical Exam Vitals and nursing note reviewed.  Constitutional:      Appearance: She is well-developed.  HENT:     Head:  Normocephalic.  Pulmonary:     Effort: Pulmonary effort is normal.  Abdominal:     General: There is no distension.  Musculoskeletal:        General: Normal range of motion.     Cervical back: Normal range of motion.  Neurological:     Mental Status: She is alert and oriented to person, place, and time.  Psychiatric:        Mood and Affect: Mood normal.     ED Results / Procedures / Treatments   Labs (all labs ordered are listed, but only abnormal results are displayed) Labs Reviewed - No data to display  EKG None  Radiology No results found.  Procedures Procedures    Medications Ordered in ED Medications  methylPREDNISolone sodium succinate (SOLU-MEDROL) 125 mg/2 mL injection 125 mg (125 mg Intramuscular Given 08/12/22 1953)  HYDROcodone-acetaminophen (NORCO/VICODIN) 5-325 MG per tablet 2 tablet (2 tablets Oral Given 08/12/22 1953)    ED Course/ Medical Decision Making/ A&P                             Medical Decision Making Patient complains of chronic pains in multiple areas of her body.  Patient is being followed by neurology and her primary care physician.  Patient reports that her evaluation to date has not shown any cause.  Patient was seen here and started on prednisone 2 days ago  Amount and/or Complexity of Data Reviewed External Data Reviewed: notes.    Details: Neurology notes reviewed primary care notes reviewed  Risk Prescription drug management. Risk Details: I will try given patient an injection of Solu-Medrol to see if this helps with her symptoms patient is given hydrocodone 10 tablets.  Patient is advised to call her primary care physician on Monday to be seen for reevaluation.           Final Clinical Impression(s) / ED Diagnoses Final diagnoses:  Myalgia    Rx / DC Orders ED Discharge Orders          Ordered    HYDROcodone-acetaminophen (NORCO/VICODIN) 5-325 MG tablet  Every 4 hours PRN        08/12/22 2104           An  After Visit Summary was printed and given to the patient.    Osie Cheeks 08/12/22 2129    Benjiman Core, MD 08/12/22 2351
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
## 2023-08-23 ENCOUNTER — Encounter (HOSPITAL_BASED_OUTPATIENT_CLINIC_OR_DEPARTMENT_OTHER): Payer: Self-pay

## 2023-08-23 ENCOUNTER — Other Ambulatory Visit: Payer: Self-pay

## 2023-08-23 DIAGNOSIS — M545 Low back pain, unspecified: Secondary | ICD-10-CM | POA: Diagnosis not present

## 2023-08-23 DIAGNOSIS — R1030 Lower abdominal pain, unspecified: Secondary | ICD-10-CM | POA: Diagnosis present

## 2023-08-23 DIAGNOSIS — Z5321 Procedure and treatment not carried out due to patient leaving prior to being seen by health care provider: Secondary | ICD-10-CM | POA: Diagnosis not present

## 2023-08-23 LAB — URINALYSIS, ROUTINE W REFLEX MICROSCOPIC
Bilirubin Urine: NEGATIVE
Glucose, UA: NEGATIVE mg/dL
Ketones, ur: NEGATIVE mg/dL
Leukocytes,Ua: NEGATIVE
Nitrite: NEGATIVE
Protein, ur: NEGATIVE mg/dL
Specific Gravity, Urine: 1.025 (ref 1.005–1.030)
pH: 7 (ref 5.0–8.0)

## 2023-08-23 LAB — CBC
HCT: 40.3 % (ref 36.0–46.0)
Hemoglobin: 12.9 g/dL (ref 12.0–15.0)
MCH: 25.5 pg — ABNORMAL LOW (ref 26.0–34.0)
MCHC: 32 g/dL (ref 30.0–36.0)
MCV: 79.6 fL — ABNORMAL LOW (ref 80.0–100.0)
Platelets: 241 10*3/uL (ref 150–400)
RBC: 5.06 MIL/uL (ref 3.87–5.11)
RDW: 15 % (ref 11.5–15.5)
WBC: 6.4 10*3/uL (ref 4.0–10.5)
nRBC: 0 % (ref 0.0–0.2)

## 2023-08-23 LAB — URINALYSIS, MICROSCOPIC (REFLEX)

## 2023-08-23 NOTE — ED Triage Notes (Signed)
 Pt. Reports lower abd pain since Monday that radiates down BLE. Pt. Reports low back pain. Denies N/V/D

## 2023-08-24 ENCOUNTER — Emergency Department (HOSPITAL_BASED_OUTPATIENT_CLINIC_OR_DEPARTMENT_OTHER)
Admission: EM | Admit: 2023-08-24 | Discharge: 2023-08-24 | Discharge status: Against Medical Advice | Attending: Emergency Medicine | Admitting: Emergency Medicine

## 2023-08-24 ENCOUNTER — Emergency Department (HOSPITAL_BASED_OUTPATIENT_CLINIC_OR_DEPARTMENT_OTHER)
Admission: EM | Admit: 2023-08-24 | Discharge: 2023-08-24 | Disposition: A | Discharge status: Home / Self Care | Attending: Emergency Medicine | Admitting: Emergency Medicine

## 2023-08-24 ENCOUNTER — Encounter (HOSPITAL_BASED_OUTPATIENT_CLINIC_OR_DEPARTMENT_OTHER): Payer: Self-pay

## 2023-08-24 DIAGNOSIS — B3731 Acute candidiasis of vulva and vagina: Secondary | ICD-10-CM | POA: Insufficient documentation

## 2023-08-24 DIAGNOSIS — M545 Low back pain, unspecified: Secondary | ICD-10-CM | POA: Diagnosis present

## 2023-08-24 LAB — CBC WITH DIFFERENTIAL/PLATELET
Abs Immature Granulocytes: 0.02 10*3/uL (ref 0.00–0.07)
Basophils Absolute: 0 10*3/uL (ref 0.0–0.1)
Basophils Relative: 0 %
Eosinophils Absolute: 0.1 10*3/uL (ref 0.0–0.5)
Eosinophils Relative: 2 %
HCT: 40.1 % (ref 36.0–46.0)
Hemoglobin: 12.6 g/dL (ref 12.0–15.0)
Immature Granulocytes: 0 %
Lymphocytes Relative: 31 %
Lymphs Abs: 1.7 10*3/uL (ref 0.7–4.0)
MCH: 25.2 pg — ABNORMAL LOW (ref 26.0–34.0)
MCHC: 31.4 g/dL (ref 30.0–36.0)
MCV: 80.2 fL (ref 80.0–100.0)
Monocytes Absolute: 0.4 10*3/uL (ref 0.1–1.0)
Monocytes Relative: 7 %
Neutro Abs: 3.2 10*3/uL (ref 1.7–7.7)
Neutrophils Relative %: 60 %
Platelets: 241 10*3/uL (ref 150–400)
RBC: 5 MIL/uL (ref 3.87–5.11)
RDW: 15.2 % (ref 11.5–15.5)
WBC: 5.5 10*3/uL (ref 4.0–10.5)
nRBC: 0 % (ref 0.0–0.2)

## 2023-08-24 LAB — COMPREHENSIVE METABOLIC PANEL WITH GFR
ALT: 33 U/L (ref 0–44)
ALT: 33 U/L (ref 0–44)
AST: 21 U/L (ref 15–41)
AST: 21 U/L (ref 15–41)
Albumin: 4.2 g/dL (ref 3.5–5.0)
Albumin: 4.3 g/dL (ref 3.5–5.0)
Alkaline Phosphatase: 86 U/L (ref 38–126)
Alkaline Phosphatase: 86 U/L (ref 38–126)
Anion gap: 11 (ref 5–15)
Anion gap: 9 (ref 5–15)
BUN: 10 mg/dL (ref 6–20)
BUN: 8 mg/dL (ref 6–20)
CO2: 26 mmol/L (ref 22–32)
CO2: 27 mmol/L (ref 22–32)
Calcium: 8.7 mg/dL — ABNORMAL LOW (ref 8.9–10.3)
Calcium: 8.8 mg/dL — ABNORMAL LOW (ref 8.9–10.3)
Chloride: 103 mmol/L (ref 98–111)
Chloride: 105 mmol/L (ref 98–111)
Creatinine, Ser: 0.82 mg/dL (ref 0.44–1.00)
Creatinine, Ser: 0.94 mg/dL (ref 0.44–1.00)
GFR, Estimated: >60 mL/min (ref >60)
GFR, Estimated: >60 mL/min (ref >60)
Glucose, Bld: 130 mg/dL — ABNORMAL HIGH (ref 70–99)
Glucose, Bld: 96 mg/dL (ref 70–99)
Potassium: 3 mmol/L — ABNORMAL LOW (ref 3.5–5.1)
Potassium: 3.1 mmol/L — ABNORMAL LOW (ref 3.5–5.1)
Sodium: 140 mmol/L (ref 135–145)
Sodium: 141 mmol/L (ref 135–145)
Total Bilirubin: 0.3 mg/dL (ref 0.0–1.2)
Total Bilirubin: 0.4 mg/dL (ref 0.0–1.2)
Total Protein: 7 g/dL (ref 6.5–8.1)
Total Protein: 7.1 g/dL (ref 6.5–8.1)

## 2023-08-24 LAB — WET PREP, GENITAL
Clue Cells Wet Prep HPF POC: NONE SEEN
Sperm: NONE SEEN
Trich, Wet Prep: NONE SEEN
WBC, Wet Prep HPF POC: <10 (ref <10)

## 2023-08-24 LAB — LIPASE, BLOOD
Lipase: 25 U/L (ref 11–51)
Lipase: 30 U/L (ref 11–51)

## 2023-08-24 MED ORDER — DOXYCYCLINE HYCLATE 100 MG PO TABS
100.0000 mg | ORAL_TABLET | Freq: Once | ORAL | Status: AC
  Administered 2023-08-24: 100 mg via ORAL
  Filled 2023-08-24: qty 1

## 2023-08-24 MED ORDER — FLUCONAZOLE 200 MG PO TABS: 400.0000 mg | ORAL_TABLET | Freq: Every day | ORAL | 0 refills | Status: DC

## 2023-08-24 MED ORDER — CEFTRIAXONE SODIUM 1 G IJ SOLR: 1.0000 g | Freq: Once | INTRAMUSCULAR | Status: DC

## 2023-08-24 MED ORDER — DOXYCYCLINE HYCLATE 100 MG PO CAPS: 100.0000 mg | ORAL_CAPSULE | Freq: Two times a day (BID) | ORAL | 0 refills | Status: AC

## 2023-08-24 MED ORDER — FLUCONAZOLE 200 MG PO TABS: 800.0000 mg | ORAL_TABLET | Freq: Once | ORAL | Status: DC

## 2023-08-24 MED ORDER — SODIUM CHLORIDE 0.9 % IV SOLN
1.0000 g | Freq: Once | INTRAVENOUS | Status: AC
  Administered 2023-08-24: 1 g via INTRAVENOUS
  Filled 2023-08-24: qty 10

## 2023-08-24 MED ORDER — FLUCONAZOLE 150 MG PO TABS: 150.0000 mg | ORAL_TABLET | Freq: Every day | ORAL | 0 refills | Status: DC

## 2023-08-24 MED ORDER — KETOROLAC TROMETHAMINE 15 MG/ML IJ SOLN
15.0000 mg | Freq: Once | INTRAMUSCULAR | Status: AC
  Administered 2023-08-24: 15 mg via INTRAVENOUS
  Filled 2023-08-24: qty 1

## 2023-08-24 MED ORDER — FLUCONAZOLE 150 MG PO TABS
150.0000 mg | ORAL_TABLET | Freq: Once | ORAL | Status: AC
  Administered 2023-08-24: 150 mg via ORAL
  Filled 2023-08-24: qty 1

## 2023-08-24 NOTE — ED Notes (Signed)
 No answer when called to triage. Pt not in lobby when checked.

## 2023-08-24 NOTE — ED Provider Notes (Addendum)
 [place] [facility] Provider Note   CSN: [id number] Arrival date & time: 08/24/23  1750     History  Chief Complaint  Patient presents with   Abdominal Pain    Benedicta Sultan is a 33 y.o. female who presents today due to a 4-day history of pelvic pain along with lower back pain.  She denies any abnormal vaginal discharge and does not endorse any vaginal bleeding, does state that she has increased discomfort with urinating over the past 24 to 48 hours.  She denies any nausea or vomiting.  She does state that she is sexually active with a single female partner and she does not use barrier contraceptives.  As such she does request that we perform STI screening including pelvic exam.   Abdominal Pain      Home Medications Prior to Admission medications   Medication Sig Start Date End Date Taking? Authorizing Provider  albuterol (VENTOLIN HFA) 108 (90 Base) MCG/ACT inhaler Inhale into the lungs. 06/10/19   [provider]  amLODipine (NORVASC) 10 MG tablet Take 1 tablet by mouth daily. 04/23/19   [provider]  benzonatate  (TESSALON ) 100 MG capsule Take 1 capsule (100 mg total) by mouth every 8 (eight) hours. 03/26/20   Henderly, Britni A, PA-C  cyclobenzaprine  (FLEXERIL ) 10 MG tablet Take 1 tablet (10 mg total) by mouth 2 (two) times daily as needed for muscle spasms. 07/15/20   Kelsey Patricia, MD  cyclobenzaprine  (FLEXERIL ) 10 MG tablet Take 1 tablet (10 mg total) by mouth 2 (two) times daily as needed for muscle spasms. 12/06/21   Tegeler, Marine Sia, MD  ferrous sulfate 325 (65 FE) MG tablet Take by mouth. 05/03/19   [provider]  fluticasone (FLONASE) 50 MCG/ACT nasal spray Place into the nose. 12/26/19 12/25/20  [provider]  levonorgestrel (MIRENA) 20 MCG/DAY IUD by Intrauterine route. 10/11/13   [provider]  lidocaine  (LIDODERM ) 5 % Place 1 patch onto the skin daily. Remove & Discard patch  within 12 hours or as directed by MD 12/06/21   Tegeler, Marine Sia, MD  methocarbamol  (ROBAXIN ) 500 MG tablet Take 1 tablet (500 mg total) by mouth 2 (two) times daily. 08/09/22   Volney Grumbles, PA-C  Norethin Ace-Eth Estrad-FE 1-20 MG-MCG(24) CHEW Chew by mouth. 09/24/18   [provider]  norethindrone (MICRONOR) 0.35 MG tablet Take 1 tablet by mouth daily. 09/26/19   [provider]  ondansetron  (ZOFRAN  ODT) 4 MG disintegrating tablet Take 1 tablet (4 mg total) by mouth every 8 (eight) hours as needed for nausea or vomiting. 03/26/20   Henderly, Britni A, PA-C  oxyCODONE -acetaminophen  (PERCOCET/ROXICET) 5-325 MG tablet Take 1 tablet by mouth every 4 (four) hours as needed for severe pain. 12/06/21   Tegeler, Marine Sia, MD  predniSONE  (DELTASONE ) 10 MG tablet Take 3 tablets (30 mg total) by mouth daily. 08/09/22   Volney Grumbles, PA-C      Allergies    Penicillins    Review of Systems   Review of Systems  Gastrointestinal:  Positive for abdominal pain.  Genitourinary:  Positive for pelvic pain.  Musculoskeletal:  Positive for back pain.  All other systems reviewed and are negative.   Physical Exam Updated Vital Signs BP 131/85 (BP Location: Left Arm)   Pulse 65   Temp 98.2 F (36.8 C) (Oral)   Resp 16   Wt 112.5 kg   LMP 01/13/2021 (Exact Date)   SpO2 100%  BMI 38.84 kg/m  Physical Exam Vitals and nursing note reviewed. Exam conducted with a chaperone present.  Constitutional:      General: She is not in acute distress.    Appearance: Normal appearance.  HENT:     Head: Normocephalic and atraumatic.     Mouth/Throat:     Mouth: Mucous membranes are moist.     Pharynx: Oropharynx is clear.  Eyes:     Extraocular Movements: Extraocular movements intact.     Conjunctiva/sclera: Conjunctivae normal.     Pupils: Pupils are equal, round, and reactive to light.  Cardiovascular:     Rate and Rhythm: Normal rate and regular rhythm.     Pulses:  Normal pulses.     Heart sounds: Normal heart sounds. No murmur heard.    No friction rub. No gallop.  Pulmonary:     Effort: Pulmonary effort is normal.     Breath sounds: Normal breath sounds.  Abdominal:     General: Abdomen is flat. Bowel sounds are normal.     Palpations: Abdomen is soft.     Tenderness: There is no abdominal tenderness. There is no right CVA tenderness or left CVA tenderness.     Hernia: There is no hernia in the left inguinal area or right inguinal area.  Genitourinary:    Pubic Area: No rash.      Labia:        Right: No rash, tenderness, lesion or injury.        Left: No rash, tenderness, lesion or injury.      Urethra: No prolapse, urethral pain, urethral swelling or urethral lesion.     Comments: Thick white discharge noted on the cervix and on the vagina Musculoskeletal:        General: Normal range of motion.     Cervical back: Normal range of motion and neck supple.     Right lower leg: No edema.     Left lower leg: No edema.  Lymphadenopathy:     Lower Body: No right inguinal adenopathy. No left inguinal adenopathy.  Skin:    General: Skin is warm and dry.     Capillary Refill: Capillary refill takes less than 2 seconds.  Neurological:     General: No focal deficit present.     Mental Status: She is alert. Mental status is at baseline.  Psychiatric:        Mood and Affect: Mood normal.     ED Results / Procedures / Treatments   Labs (all labs ordered are listed, but only abnormal results are displayed) Labs Reviewed  WET PREP, GENITAL  CBC WITH DIFFERENTIAL/PLATELET  COMPREHENSIVE METABOLIC PANEL WITH GFR  LIPASE, BLOOD  URINALYSIS, ROUTINE W REFLEX MICROSCOPIC  RPR  HIV ANTIBODY (ROUTINE TESTING W REFLEX)  GC/CHLAMYDIA PROBE AMP ([place]) NOT AT [facility]    EKG None  Radiology No results found.  Procedures Procedures    Medications Ordered in ED Medications - No data to display  ED Course/ Medical Decision Making/ A&P                                  Medical Decision Making Amount and/or Complexity of Data Reviewed Labs: ordered.  Risk Prescription drug management.   Medical Decision Making:   Alanie Syler is a 33 y.o. female who presented to the ED today with pelvic pain and lower back pain detailed above.  Complete initial physical exam performed, notably the patient  was alert and oriented in no apparent distress.  Abdomen is nontender, is normal-appearing, and does not not distended.  There is no CVA tenderness.   Pelvic exam did show thick white discharge on the cervix and on the vaginal surface.  There is no cervical motion tenderness with bimanual exam. Reviewed and confirmed nursing documentation for past medical history, family history, social history.    Initial Assessment:   With the patient's presentation of lower abdominal pain, most likely diagnosis is vaginal candidiasis. Other diagnoses were considered including (but not limited to) bacterial vaginosis, pelvic inflammatory disease, ectopic pregnancy. These are considered less likely due to history of present illness and physical exam findings.     Initial Plan:  Perform pelvic exam and obtain wet prep and GC swabs Obtain RPR as well as HIV blood titers at patient request. Screening labs including CBC and Metabolic panel to evaluate for infectious or metabolic etiology of disease.  Urinalysis with reflex culture ordered to evaluate for UTI or relevant urologic/nephrologic pathology.   Objective evaluation as below reviewed   Initial Study Results:   Laboratory  All laboratory results reviewed without evidence of clinically relevant pathology.   Exceptions include: Wet prep is positive for yeast.   Reassessment and Plan:   Based on clinical exam and laboratory findings of yeast on prep, her symptoms are likely due to vaginal candidiasis and will manage outpatient with Diflucan .  Will have patient follow-up with her primary  care for further management and treatment.  Further, as bacterial cultures are still pending, will treat empirically for potential bacterial infections with oral doxycycline .  Also provide single dose of 1 g ceftriaxone .  This was explained to the patient and she understands and agrees with this course of treatment.  Will follow-up outpatient with her primary care provider.  Evaluated the patient's allergy to penicillins given cephalosporins administration.  She has tolerated previously without significant reaction.          Final Clinical Impression(s) / ED Diagnoses Final diagnoses:  None    Rx / DC Orders ED Discharge Orders     None         Juanetta Nordmann, PA 08/24/23 2246    Juanetta Nordmann, PA 08/24/23 2256    Juanetta Nordmann, PA 08/24/23 2300    Teddi Favors, DO 08/29/23 [phone]

## 2023-08-24 NOTE — ED Triage Notes (Signed)
 Pt arrives with c/o lower ABD pain, lower back pain, and vaginal irration that started on Monday. Pt denies vaginal discharge or bleeding.

## 2023-08-25 LAB — RPR: RPR Ser Ql: NONREACTIVE

## 2023-08-25 LAB — HIV ANTIBODY (ROUTINE TESTING W REFLEX): HIV Screen 4th Generation wRfx: NONREACTIVE

## 2023-08-28 LAB — GC/CHLAMYDIA PROBE AMP (~~LOC~~) NOT AT ARMC
Chlamydia: NEGATIVE
Comment: NEGATIVE
Comment: NORMAL
Neisseria Gonorrhea: NEGATIVE
